# Patient Record
Sex: Male | Born: 1940 | Race: White | Hispanic: No | Marital: Married | Smoking: Former smoker
Health system: Southern US, Community
[De-identification: ages and names within clinical notes are randomized; demographics above are authoritative.]

## PROBLEM LIST (undated history)

## (undated) DIAGNOSIS — I10 Essential (primary) hypertension: Secondary | ICD-10-CM

## (undated) DIAGNOSIS — N400 Enlarged prostate without lower urinary tract symptoms: Secondary | ICD-10-CM

## (undated) DIAGNOSIS — M1990 Unspecified osteoarthritis, unspecified site: Secondary | ICD-10-CM

## (undated) DIAGNOSIS — K635 Polyp of colon: Secondary | ICD-10-CM

## (undated) DIAGNOSIS — K219 Gastro-esophageal reflux disease without esophagitis: Secondary | ICD-10-CM

## (undated) DIAGNOSIS — K317 Polyp of stomach and duodenum: Secondary | ICD-10-CM

## (undated) DIAGNOSIS — L570 Actinic keratosis: Secondary | ICD-10-CM

## (undated) HISTORY — DX: Polyp of stomach and duodenum: K31.7

## (undated) HISTORY — PX: SHOULDER SURGERY: SHX246

## (undated) HISTORY — DX: Unspecified osteoarthritis, unspecified site: M19.90

## (undated) HISTORY — DX: Polyp of colon: K63.5

## (undated) HISTORY — DX: Gastro-esophageal reflux disease without esophagitis: K21.9

## (undated) HISTORY — DX: Benign prostatic hyperplasia without lower urinary tract symptoms: N40.0

## (undated) HISTORY — PX: OTHER SURGICAL HISTORY: SHX169

## (undated) HISTORY — PX: TONSILLECTOMY: SUR1361

## (undated) HISTORY — DX: Actinic keratosis: L57.0

---

## 2004-09-04 ENCOUNTER — Encounter: Admission: RE | Admit: 2004-09-04 | Discharge: 2004-09-04 | Payer: Self-pay | Admitting: Internal Medicine

## 2005-03-28 ENCOUNTER — Emergency Department (HOSPITAL_COMMUNITY): Admission: EM | Admit: 2005-03-28 | Discharge: 2005-03-28 | Payer: Self-pay | Admitting: Family Medicine

## 2005-03-30 ENCOUNTER — Emergency Department (HOSPITAL_COMMUNITY): Admission: EM | Admit: 2005-03-30 | Discharge: 2005-03-30 | Payer: Self-pay | Admitting: Emergency Medicine

## 2005-12-20 ENCOUNTER — Ambulatory Visit (HOSPITAL_COMMUNITY): Admission: RE | Admit: 2005-12-20 | Discharge: 2005-12-20 | Payer: Self-pay | Admitting: Gastroenterology

## 2006-11-18 ENCOUNTER — Encounter: Admission: RE | Admit: 2006-11-18 | Discharge: 2006-11-18 | Payer: Self-pay | Admitting: Internal Medicine

## 2012-06-26 ENCOUNTER — Emergency Department (HOSPITAL_COMMUNITY): Payer: Medicare Other

## 2012-06-26 ENCOUNTER — Encounter (HOSPITAL_COMMUNITY): Payer: Self-pay | Admitting: Emergency Medicine

## 2012-06-26 ENCOUNTER — Emergency Department (HOSPITAL_COMMUNITY)
Admission: EM | Admit: 2012-06-26 | Discharge: 2012-06-26 | Disposition: A | Discharge status: Home / Self Care | Payer: Medicare Other | Attending: Emergency Medicine | Admitting: Emergency Medicine

## 2012-06-26 DIAGNOSIS — S61412A Laceration without foreign body of left hand, initial encounter: Secondary | ICD-10-CM

## 2012-06-26 DIAGNOSIS — S61409A Unspecified open wound of unspecified hand, initial encounter: Secondary | ICD-10-CM | POA: Insufficient documentation

## 2012-06-26 DIAGNOSIS — Z1833 Retained wood fragments: Secondary | ICD-10-CM | POA: Insufficient documentation

## 2012-06-26 DIAGNOSIS — W298XXA Contact with other powered powered hand tools and household machinery, initial encounter: Secondary | ICD-10-CM | POA: Insufficient documentation

## 2012-06-26 MED ORDER — OXYCODONE-ACETAMINOPHEN 5-325 MG PO TABS
1.0000 | ORAL_TABLET | Freq: Once | ORAL | Status: AC
  Administered 2012-06-26: 1 via ORAL

## 2012-06-26 MED ORDER — LIDOCAINE-EPINEPHRINE (PF) 2 %-1:200000 IJ SOLN
20.0000 mL | Freq: Once | INTRAMUSCULAR | Status: AC
  Administered 2012-06-26: 20 mL via INTRADERMAL

## 2012-06-26 MED ORDER — CEPHALEXIN 500 MG PO CAPS: 500.0000 mg | ORAL_CAPSULE | Freq: Four times a day (QID) | ORAL | Status: DC

## 2012-06-26 MED ORDER — TRAMADOL HCL 50 MG PO TABS: 50.0000 mg | ORAL_TABLET | Freq: Four times a day (QID) | ORAL | Status: DC | PRN

## 2012-06-26 NOTE — Progress Notes (Signed)
Orthopedic Tech Progress Note Patient Details:  Carl Moody 27-Nov-1940 [id number]  Ortho Devices Type of Ortho Device: Velcro wrist splint Ortho Device/Splint Location: left wrist Ortho Device/Splint Interventions: Application   Nikki Dom 06/26/2012, 3:51 PM

## 2012-06-26 NOTE — ED Notes (Signed)
Patient wound placed in 10 % betadine solution

## 2012-06-26 NOTE — ED Notes (Signed)
Pt reports cut left hand on a table saw. Bleeding controlled at this time. Pt able to wiggle all fingers. Pulses present.

## 2012-06-26 NOTE — ED Notes (Signed)
Laveda Norman , [place] attending to laceration at this time

## 2012-06-26 NOTE — ED Provider Notes (Signed)
History     CSN: [id number]  Arrival date & time 06/26/12  1224   First MD Initiated Contact with Patient 06/26/12 1301      Chief Complaint  Patient presents with  . Extremity Laceration    Left hand    (Consider location/radiation/quality/duration/timing/severity/associated sxs/prior treatment) HPI  71 year old male presents for evaluation of laceration to L hand.  Pt reports accidentally cuts his hand on table saw.  Incident happened 2 hrs ago.  Wound to dorsum of hand.  Bleeding controlled.  Able to move fingers and denies numbness or weakness.  Tetanus shot 2 years ago.  No other injury.  Is R handed.      History reviewed. No pertinent past medical history.  History reviewed. No pertinent past surgical history.  No family history on file.  History  Substance Use Topics  . Smoking status: Never Smoker   . Smokeless tobacco: Not on file  . Alcohol Use: Yes      Review of Systems  All other systems reviewed and are negative.    Allergies  Review of patient's allergies indicates no known allergies.  Home Medications   Current Outpatient Rx  Name Route Sig Dispense Refill  . OSTEO BI-FLEX ADV DOUBLE ST PO Oral Take 2 tablets by mouth daily.    . ADULT MULTIVITAMIN W/MINERALS CH Oral Take 1 tablet by mouth daily.    Marland Kitchen PANTOPRAZOLE SODIUM 40 MG PO TBEC Oral Take 40 mg by mouth daily.      BP 140/75  Pulse 60  Temp 97.8 F (36.6 C) (Oral)  Resp 20  SpO2 100%  Physical Exam  Nursing note and vitals reviewed. Constitutional: He is oriented to person, place, and time. He appears well-developed and well-nourished. No distress.  HENT:  Head: Normocephalic and atraumatic.  Eyes: Conjunctivae normal are normal.  Neck: Neck supple.  Musculoskeletal:       L hand: 7cm superficial lacerations horizontally across dorsum of hand proximally.  Wound appears dirty with table saw.  Radial pulse 2+, full ROM to all fingers with flexion and extension against resistance.   NVI.   L wrist normal with FROM.    Neurological: He is alert and oriented to person, place, and time.  Skin: Skin is warm. No rash noted.  Psychiatric: He has a normal mood and affect.    ED Course  Procedures (including critical care time)  Labs Reviewed - No data to display Dg Hand Complete Left  06/26/2012  *RADIOLOGY REPORT*  Clinical Data: History of table saw causing laceration.  LEFT HAND - COMPLETE 3+ VIEW  Comparison: None.  Findings: There appears to be some soft tissue swelling.  No opaque foreign body is seen.  No fracture or dislocation is seen. 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  IMPRESSION: Soft tissue injury with swelling.  No opaque foreign body evident. No fracture dislocation evident.  Osteoarthritic changes are described above.   Original Report Authenticated By: Crawford Givens, M.D.      No diagnosis found.  LACERATION REPAIR Performed by: Fayrene Helper, resident Sara Chu Authorized by: Fayrene Helper Consent: Verbal consent obtained. Risks and benefits: risks, benefits and alternatives were discussed Consent given by: patient Patient identity confirmed: provided demographic data Prepped and Draped in normal sterile fashion Wound explored  Laceration Location: L hand, proximal dorsum  Laceration Length: 7cm  Saw dust on  hand were fully irrigated and cleansed  Anesthesia: local infiltration  Local anesthetic: lidocaine 2% w epinephrine  Anesthetic total: 10 ml  Irrigation method: syringe Amount of cleaning: standard  Skin closure: prolene 5.0  Number of sutures: 11  Technique: simple interrupted, approximation of wound edge, sharp debridement using sterile scissor  Patient tolerance: Patient tolerated the procedure well with no immediate complications.  1. Left hand superficial laceration  MDM  Superficial laceration to L  dorsum of hand, neurovascularly intact.  Wound were irrigated and cleansed.  Pt understand there's always risk of retained fb (sawdust).  Wound were sutured by myself and resident.  Plan to apply wrist splint for protection to prevent dehiscence.  Pt to f/u with PCP in 10 days to have sutures remove.  Will provide abx as wound is dirty.  Pain medication prescribed.  Hand referral as needed.     Discussed care with my attending.    Nursing notes reviewed and considered in documentation  Previous records reviewed and considered  All labs/vitals reviewed and considered  xrays reviewed and considered      Fayrene Helper, PA-C 06/26/12 1516

## 2012-06-30 NOTE — ED Provider Notes (Signed)
Medical screening examination/treatment/procedure(s) were performed by non-physician practitioner and as supervising physician I was immediately available for consultation/collaboration.   Jaylin Benzel E Silvia Markuson, MD 06/30/12 0843 

## 2013-05-26 ENCOUNTER — Encounter: Payer: Self-pay | Admitting: *Deleted

## 2013-05-26 ENCOUNTER — Other Ambulatory Visit: Payer: Self-pay | Admitting: *Deleted

## 2013-05-26 ENCOUNTER — Encounter (INDEPENDENT_AMBULATORY_CARE_PROVIDER_SITE_OTHER): Payer: Medicare Other

## 2013-05-26 DIAGNOSIS — R002 Palpitations: Secondary | ICD-10-CM

## 2013-05-26 NOTE — Progress Notes (Signed)
Order per Dr Avva/ placed under Dr [place].

## 2013-05-26 NOTE — Progress Notes (Signed)
Patient ID: Carl Moody, male   DOB: 17-May-1941, 72 y.o.   MRN: [id number] E-Cardio verite 30 day cardiac event monitor applied to patient.

## 2013-07-01 ENCOUNTER — Telehealth: Payer: Self-pay

## 2013-07-01 NOTE — Telephone Encounter (Signed)
Patient called Dr.Jordan reviewed event monitor which revealed normal sinus rhythm with occasional pvc's.

## 2013-07-22 ENCOUNTER — Encounter (INDEPENDENT_AMBULATORY_CARE_PROVIDER_SITE_OTHER): Payer: Self-pay

## 2013-07-22 ENCOUNTER — Encounter: Payer: Self-pay | Admitting: Cardiology

## 2013-07-22 ENCOUNTER — Ambulatory Visit (INDEPENDENT_AMBULATORY_CARE_PROVIDER_SITE_OTHER): Payer: Medicare Other | Admitting: Cardiology

## 2013-07-22 VITALS — BP 130/80 | HR 72 | Ht 66.0 in | Wt 161.0 lb

## 2013-07-22 DIAGNOSIS — I493 Ventricular premature depolarization: Secondary | ICD-10-CM

## 2013-07-22 DIAGNOSIS — I4949 Other premature depolarization: Secondary | ICD-10-CM

## 2013-07-22 NOTE — Progress Notes (Signed)
Carl Moody Date of Birth: 1940-10-10 Medical Record #[id number]  History of Present Illness: [name] is seen at the request of Dr. Felipa Eth for evaluation of palpitations. He is a pleasant 72 year old male, previously healthy who reports that in August he began experiencing symptoms of palpitations. He states he felt like he had a sudden rush of adrenaline in his chest and his heart would race. It would last 15-20 seconds and then resolved. Initially this was occurring several times a day but then it tapered off and within 2-3 weeks his symptoms had resolved. He denied any associated chest pain, shortness of breath, dizziness, or syncope. There were no clear aggravating features. He denies any use of decongestants. He only drinks one or 2 caffeinated beverages a day.   Current Outpatient Prescriptions on File Prior to Visit  Medication Sig Dispense Refill  . Misc Natural Products (OSTEO BI-FLEX ADV DOUBLE ST PO) Take 2 tablets by mouth daily.      . Multiple Vitamin (MULTIVITAMIN WITH MINERALS) TABS Take 1 tablet by mouth daily.      . traMADol (ULTRAM) 50 MG tablet Take 1 tablet (50 mg total) by mouth every 6 (six) hours as needed for pain.  15 tablet  0   No current facility-administered medications on file prior to visit.    No Known Allergies  Past Medical History  Diagnosis Date  . GERD (gastroesophageal reflux disease)   . DJD (degenerative joint disease)   . BPH (benign prostatic hyperplasia)     Past Surgical History  Procedure Laterality Date  . Cataracts    . Shoulder surgery Right     History  Smoking status  . Former Smoker  Smokeless tobacco  . Not on file    History  Alcohol Use  . Yes    History reviewed. No pertinent family history.  Review of Systems:  As noted in history of present illness .  All other systems were reviewed and are negative.  Physical Exam: BP 130/80  Pulse 72  Ht 5\' 6"  (1.676 m)  Wt 161 lb (73.029 kg)  BMI 26 kg/m2 He is  a pleasant white male in no acute distress. HEENT: Normocephalic, atraumatic. Pupils equal round and reactive to light accommodation. Extraocular movements are full. Oropharynx is clear. Neck is without JVD, adenopathy, thyromegaly, or bruits. Lungs: Clear Cardiovascular: Regular rate and rhythm. Normal S1 and S2. No gallop, murmur, or click. Abdomen: Soft and nontender. No masses or bruits. Extremities: No cyanosis or edema. Pulses are 2+ and symmetric. Skin: Warm and dry Neuro: Alert and oriented x3. Cranial nerves II through XII are intact.  LABORATORY DATA:  laboratory data reviewed from 05/25/2013. This included a complete chemistry panel, CBC, and TSH. These were all normal with exception of a potassium of 5.4. ECG demonstrates normal sinus rhythm with sinus arrhythmia. It is otherwise normal.  Event monitor was reviewed from August 19 through 06/24/2013. This demonstrated normal sinus rhythm with occasional PVC. Symptoms of rapid or fast heartbeat did not correlate with arrhythmia. He had one episode of sinus tachycardia a rate of 149 beats per minute but the patient cannot recall when his activity was at that time.   Assessment / Plan:  1.PVCs. These do not correlate with his symptoms. Event monitor showed no significant arrhythmia during his symptoms of tachycardia. Given the lack of any significant cardiac risk factors, normal ECG, and normal cardiac exam I think these PVCs are benign. I recommended he limit his caffeine intake. Otherwise,  no further cardiac evaluation warranted. I will see him as needed.

## 2013-07-22 NOTE — Patient Instructions (Signed)
Let me know if your symptoms recur/worsen.  I will see you as needed.

## 2015-03-11 ENCOUNTER — Ambulatory Visit: Payer: PRIVATE HEALTH INSURANCE | Admitting: Physician Assistant

## 2015-04-29 ENCOUNTER — Other Ambulatory Visit: Payer: Self-pay | Admitting: Physician Assistant

## 2015-06-07 ENCOUNTER — Telehealth: Payer: Self-pay | Admitting: Cardiology

## 2015-06-07 NOTE — Telephone Encounter (Signed)
Received records from [facility] for appointment with Dr Martinique on 06/08/15.  Records given to [facility] (medical records) for Dr Doug Sou schedule on 06/08/15. lp

## 2015-06-08 ENCOUNTER — Encounter: Payer: Self-pay | Admitting: Cardiology

## 2015-06-08 ENCOUNTER — Ambulatory Visit (INDEPENDENT_AMBULATORY_CARE_PROVIDER_SITE_OTHER): Payer: Commercial Managed Care - HMO | Admitting: Cardiology

## 2015-06-08 VITALS — BP 138/84 | HR 75 | Ht 65.0 in | Wt 161.0 lb

## 2015-06-08 DIAGNOSIS — R079 Chest pain, unspecified: Secondary | ICD-10-CM | POA: Diagnosis not present

## 2015-06-08 DIAGNOSIS — I493 Ventricular premature depolarization: Secondary | ICD-10-CM

## 2015-06-08 NOTE — Patient Instructions (Signed)
We will schedule you for a stress test  

## 2015-06-08 NOTE — Progress Notes (Signed)
   Carl Moody Date of Birth: 1940-10-12 Medical Record #[id number]  History of Present Illness: Carl Moody is seen at the request of Dr. Dagmar Hait for evaluation of exertional chest pain. He is a pleasant 74 year old male, who was seen in 2014 with some minor palpitations. noted to have occ. PVC. These were felt to be benign and subsequently his symptoms resolved. Over the past 6 months he has noted symptoms of chest tightness and SOB with exertion. This occurs with walking and is worse walking up hill or carrying a load. Relieved with rest. Lasts a couple of minutes. No diaphoresis, N/V. No radiation. Localized to central chest. No history of DM, HTN, hypercholesterolemia, or family history of CAD. Quit smoking in 1971.  Current Outpatient Prescriptions on Moody Prior to Visit  Medication Sig Dispense Refill  . Misc Natural Products (OSTEO BI-FLEX ADV DOUBLE ST PO) Take 2 tablets by mouth daily.    . Multiple Vitamin (MULTIVITAMIN WITH MINERALS) TABS Take 1 tablet by mouth daily.    Marland Kitchen omeprazole (PRILOSEC) 40 MG capsule Take 40 mg by mouth daily.    Earney Navy Bicarbonate (ZEGERID PO) Take 40 mg by mouth.     No current facility-administered medications on Moody prior to visit.    No Known Allergies  Past Medical History  Diagnosis Date  . GERD (gastroesophageal reflux disease)   . DJD (degenerative joint disease)   . BPH (benign prostatic hyperplasia)   . Gastric polyp   . Hyperplastic colon polyp     Past Surgical History  Procedure Laterality Date  . Cataracts    . Shoulder surgery Right     History  Smoking status  . Former Smoker  Smokeless tobacco  . Not on Moody    History  Alcohol Use  . Yes    History reviewed. No pertinent family history.  Review of Systems:  As noted in history of present illness .  All other systems were reviewed and are negative.  Physical Exam: BP 138/84 mmHg  Pulse 75  Ht 5\' 5"  (1.651 m)  Wt 73.029 kg (161 lb)  BMI 26.79  kg/m2 He is a pleasant white male in no acute distress. HEENT: Normocephalic, atraumatic. Pupils equal round and reactive to light accommodation. Extraocular movements are full. Oropharynx is clear. Neck is without JVD, adenopathy, thyromegaly, or bruits. Lungs: Clear Cardiovascular: Regular rate and rhythm. Normal S1 and S2. No gallop, murmur, or click. Abdomen: Soft and nontender. No masses or bruits. Extremities: No cyanosis or edema. Pulses are 2+ and symmetric. Some varicosities left leg.  Skin: Warm and dry Neuro: Alert and oriented x3. Cranial nerves II through XII are intact.  LABORATORY DATA:  laboratory data reviewed from 02/14/15. This included a complete chemistry panel, CBC, and TSH. These were all normal with exception of a potassium of 5.4. Cholesterol 167, triglycerides 113, HDL 51, LDL 93. UA normal.  ECG today shows normal sinus rhythm with rate 64. Normal. I have personally reviewed and interpreted this study.    Assessment / Plan:  1. Exertion chest pain and dyspnea. Very concerning for angina. No cardiac risk factors. Normal Ecg. Will arrange for exercise stress test. If normal may try conservative medical therapy. If abnormal will proceed with cardiac cath.   2. PVCs. Asymptomatic now.

## 2015-06-29 ENCOUNTER — Telehealth (HOSPITAL_COMMUNITY): Payer: Self-pay

## 2015-06-29 NOTE — Telephone Encounter (Signed)
Encounter complete. 

## 2015-07-01 ENCOUNTER — Ambulatory Visit (HOSPITAL_COMMUNITY)
Admission: RE | Admit: 2015-07-01 | Discharge: 2015-07-01 | Disposition: A | Discharge status: Home / Self Care | Payer: Commercial Managed Care - HMO | Source: Ambulatory Visit | Attending: Cardiology | Admitting: Cardiology

## 2015-07-01 DIAGNOSIS — R079 Chest pain, unspecified: Secondary | ICD-10-CM | POA: Insufficient documentation

## 2015-07-01 DIAGNOSIS — R9439 Abnormal result of other cardiovascular function study: Secondary | ICD-10-CM | POA: Diagnosis not present

## 2015-07-01 LAB — EXERCISE TOLERANCE TEST
CHL CUP STRESS STAGE 1 HR: 72 {beats}/min
CHL CUP STRESS STAGE 4 DBP: 69 mmHg
CHL CUP STRESS STAGE 5 HR: 134 {beats}/min
CHL CUP STRESS STAGE 7 HR: 81 {beats}/min
CHL RATE OF PERCEIVED EXERTION: 16
CSEPED: 4 min
CSEPPHR: 134 {beats}/min
Estimated workload: 6.2 METS
Exercise duration (sec): 22 s
MPHR: 146 {beats}/min
Percent HR: 91 %
Percent of predicted max HR: 91 %
Rest HR: 66 {beats}/min
Stage 1 DBP: 94 mmHg
Stage 1 Grade: 0 %
Stage 1 SBP: 149 mmHg
Stage 1 Speed: 0 mph
Stage 2 Grade: 0 %
Stage 2 HR: 73 {beats}/min
Stage 2 Speed: 1 mph
Stage 3 Grade: 0.1 %
Stage 3 HR: 73 {beats}/min
Stage 3 Speed: 1 mph
Stage 4 Grade: 10 %
Stage 4 HR: 123 {beats}/min
Stage 4 SBP: 144 mmHg
Stage 4 Speed: 1.7 mph
Stage 5 Grade: 12 %
Stage 5 Speed: 2.5 mph
Stage 6 DBP: 82 mmHg
Stage 6 Grade: 0 %
Stage 6 HR: 108 {beats}/min
Stage 6 SBP: 131 mmHg
Stage 6 Speed: 0 mph
Stage 7 DBP: 94 mmHg
Stage 7 Grade: 0 %
Stage 7 SBP: 158 mmHg
Stage 7 Speed: 0 mph

## 2015-07-04 ENCOUNTER — Other Ambulatory Visit: Payer: Self-pay

## 2015-07-04 DIAGNOSIS — R079 Chest pain, unspecified: Secondary | ICD-10-CM

## 2015-07-07 ENCOUNTER — Ambulatory Visit
Admission: RE | Admit: 2015-07-07 | Discharge: 2015-07-07 | Disposition: A | Discharge status: Home / Self Care | Payer: Commercial Managed Care - HMO | Source: Ambulatory Visit | Attending: Cardiology | Admitting: Cardiology

## 2015-07-07 ENCOUNTER — Other Ambulatory Visit: Payer: Self-pay | Admitting: Cardiology

## 2015-07-07 DIAGNOSIS — R079 Chest pain, unspecified: Secondary | ICD-10-CM

## 2015-07-08 LAB — CBC WITH DIFFERENTIAL/PLATELET
BASOS ABS: 0 10*3/uL (ref 0.0–0.1)
BASOS PCT: 0 % (ref 0–1)
Eosinophils Absolute: 0.3 10*3/uL (ref 0.0–0.7)
Eosinophils Relative: 4 % (ref 0–5)
HCT: 49.9 % (ref 39.0–52.0)
Hemoglobin: 16.4 g/dL (ref 13.0–17.0)
Lymphocytes Relative: 27 % (ref 12–46)
Lymphs Abs: 1.8 10*3/uL (ref 0.7–4.0)
MCH: 30 pg (ref 26.0–34.0)
MCHC: 32.9 g/dL (ref 30.0–36.0)
MCV: 91.2 fL (ref 78.0–100.0)
MPV: 10.9 fL (ref 8.6–12.4)
Monocytes Absolute: 0.8 10*3/uL (ref 0.1–1.0)
Monocytes Relative: 12 % (ref 3–12)
NEUTROS ABS: 3.8 10*3/uL (ref 1.7–7.7)
Neutrophils Relative %: 57 % (ref 43–77)
Platelets: 228 10*3/uL (ref 150–400)
RBC: 5.47 MIL/uL (ref 4.22–5.81)
RDW: 14.3 % (ref 11.5–15.5)
WBC: 6.7 10*3/uL (ref 4.0–10.5)

## 2015-07-08 LAB — BASIC METABOLIC PANEL
BUN: 18 mg/dL (ref 7–25)
CO2: 30 mmol/L (ref 20–31)
Calcium: 9.4 mg/dL (ref 8.6–10.3)
Chloride: 101 mmol/L (ref 98–110)
Creat: 1.14 mg/dL (ref 0.70–1.18)
Glucose, Bld: 82 mg/dL (ref 65–99)
POTASSIUM: 4.7 mmol/L (ref 3.5–5.3)
SODIUM: 139 mmol/L (ref 135–146)

## 2015-07-08 LAB — PROTIME-INR
INR: 0.99 (ref <1.50)
Prothrombin Time: 13.2 seconds (ref 11.6–15.2)

## 2015-07-13 ENCOUNTER — Encounter (HOSPITAL_COMMUNITY): Payer: Self-pay | Admitting: Cardiology

## 2015-07-13 ENCOUNTER — Ambulatory Visit (HOSPITAL_COMMUNITY)
Admission: RE | Admit: 2015-07-13 | Discharge: 2015-07-13 | Disposition: A | Discharge status: Home / Self Care | Payer: Commercial Managed Care - HMO | Source: Ambulatory Visit | Attending: Cardiology | Admitting: Cardiology

## 2015-07-13 ENCOUNTER — Encounter (HOSPITAL_COMMUNITY)
Admission: RE | Disposition: A | Discharge status: Home / Self Care | Payer: Self-pay | Source: Ambulatory Visit | Attending: Cardiology

## 2015-07-13 DIAGNOSIS — R9439 Abnormal result of other cardiovascular function study: Secondary | ICD-10-CM | POA: Insufficient documentation

## 2015-07-13 DIAGNOSIS — N400 Enlarged prostate without lower urinary tract symptoms: Secondary | ICD-10-CM | POA: Diagnosis not present

## 2015-07-13 DIAGNOSIS — I493 Ventricular premature depolarization: Secondary | ICD-10-CM | POA: Diagnosis not present

## 2015-07-13 DIAGNOSIS — R079 Chest pain, unspecified: Secondary | ICD-10-CM | POA: Diagnosis present

## 2015-07-13 DIAGNOSIS — Z87891 Personal history of nicotine dependence: Secondary | ICD-10-CM | POA: Insufficient documentation

## 2015-07-13 DIAGNOSIS — K219 Gastro-esophageal reflux disease without esophagitis: Secondary | ICD-10-CM | POA: Insufficient documentation

## 2015-07-13 DIAGNOSIS — K317 Polyp of stomach and duodenum: Secondary | ICD-10-CM | POA: Insufficient documentation

## 2015-07-13 HISTORY — PX: CARDIAC CATHETERIZATION: SHX172

## 2015-07-13 SURGERY — LEFT HEART CATH AND CORONARY ANGIOGRAPHY

## 2015-07-13 MED ORDER — SODIUM CHLORIDE 0.9 % IJ SOLN: 3.0000 mL | INTRAMUSCULAR | Status: DC | PRN

## 2015-07-13 MED ORDER — FENTANYL CITRATE (PF) 100 MCG/2ML IJ SOLN
INTRAMUSCULAR | Status: AC
  Filled 2015-07-13: qty 4

## 2015-07-13 MED ORDER — SODIUM CHLORIDE 0.9 % WEIGHT BASED INFUSION
3.0000 mL/kg/h | INTRAVENOUS | Status: DC
  Administered 2015-07-13: 3 mL/kg/h via INTRAVENOUS

## 2015-07-13 MED ORDER — FENTANYL CITRATE (PF) 100 MCG/2ML IJ SOLN
INTRAMUSCULAR | Status: DC | PRN
  Administered 2015-07-13: 25 ug via INTRAVENOUS

## 2015-07-13 MED ORDER — MIDAZOLAM HCL 2 MG/2ML IJ SOLN
INTRAMUSCULAR | Status: DC | PRN
  Administered 2015-07-13: 1 mg via INTRAVENOUS

## 2015-07-13 MED ORDER — LIDOCAINE HCL (PF) 1 % IJ SOLN
INTRAMUSCULAR | Status: AC
  Filled 2015-07-13: qty 30

## 2015-07-13 MED ORDER — ASPIRIN 81 MG PO CHEW
CHEWABLE_TABLET | ORAL | Status: AC
  Administered 2015-07-13: 81 mg via ORAL
  Filled 2015-07-13: qty 1

## 2015-07-13 MED ORDER — LIDOCAINE HCL (PF) 1 % IJ SOLN
INTRAMUSCULAR | Status: DC | PRN
  Administered 2015-07-13: 11:00:00

## 2015-07-13 MED ORDER — SODIUM CHLORIDE 0.9 % IJ SOLN: 3.0000 mL | Freq: Two times a day (BID) | INTRAMUSCULAR | Status: DC

## 2015-07-13 MED ORDER — ASPIRIN 81 MG PO CHEW
81.0000 mg | CHEWABLE_TABLET | ORAL | Status: AC
  Administered 2015-07-13: 81 mg via ORAL

## 2015-07-13 MED ORDER — SODIUM CHLORIDE 0.9 % IV SOLN: 250.0000 mL | INTRAVENOUS | Status: DC | PRN

## 2015-07-13 MED ORDER — HEPARIN SODIUM (PORCINE) 1000 UNIT/ML IJ SOLN
INTRAMUSCULAR | Status: AC
  Filled 2015-07-13: qty 1

## 2015-07-13 MED ORDER — SODIUM CHLORIDE 0.9 % WEIGHT BASED INFUSION: 3.0000 mL/kg/h | INTRAVENOUS | Status: DC

## 2015-07-13 MED ORDER — HEPARIN SODIUM (PORCINE) 1000 UNIT/ML IJ SOLN
INTRAMUSCULAR | Status: DC | PRN
  Administered 2015-07-13: 4000 [IU] via INTRAVENOUS

## 2015-07-13 MED ORDER — MIDAZOLAM HCL 2 MG/2ML IJ SOLN
INTRAMUSCULAR | Status: AC
  Filled 2015-07-13: qty 4

## 2015-07-13 MED ORDER — SODIUM CHLORIDE 0.9 % WEIGHT BASED INFUSION: 1.0000 mL/kg/h | INTRAVENOUS | Status: DC

## 2015-07-13 MED ORDER — VERAPAMIL HCL 2.5 MG/ML IV SOLN
INTRAVENOUS | Status: DC | PRN
  Administered 2015-07-13: 11:00:00 via INTRA_ARTERIAL

## 2015-07-13 SURGICAL SUPPLY — 11 items

## 2015-07-13 NOTE — Interval H&P Note (Signed)
History and Physical Interval Note:  07/13/2015 10:21 AM  Carl Moody  has presented today for surgery, with the diagnosis of cp  The various methods of treatment have been discussed with the patient and family. After consideration of risks, benefits and other options for treatment, the patient has consented to  Procedure(s): Left Heart Cath and Coronary Angiography (N/A) as a surgical intervention .  The patient's history has been reviewed, patient examined, no change in status, stable for surgery.  I have reviewed the patient's chart and labs.  Questions were answered to the patient's satisfaction.    Cath Lab Visit (complete for each Cath Lab visit)  Clinical Evaluation Leading to the Procedure:   ACS: No.  Non-ACS:    Anginal Classification: CCS III  Anti-ischemic medical therapy: No Therapy  Non-Invasive Test Results: Intermediate-risk stress test findings: cardiac mortality 1-3%/year  Prior CABG: No previous CABG       [name] [facility] 07/13/2015 10:22 AM

## 2015-07-13 NOTE — Discharge Instructions (Signed)
Radial Site Care °Refer to this sheet in the next few weeks. These instructions provide you with information about caring for yourself after your procedure. Your health care provider may also give you more specific instructions. Your treatment has been planned according to current medical practices, but problems sometimes occur. Call your health care provider if you have any problems or questions after your procedure. °WHAT TO EXPECT AFTER THE PROCEDURE °After your procedure, it is typical to have the following: °· Bruising at the radial site that usually fades within 1-2 weeks. °· Blood collecting in the tissue (hematoma) that may be painful to the touch. It should usually decrease in size and tenderness within 1-2 weeks. °HOME CARE INSTRUCTIONS °· Take medicines only as directed by your health care provider. °· You may shower 24-48 hours after the procedure or as directed by your health care provider. Remove the bandage (dressing) and gently wash the site with plain soap and water. Pat the area dry with a clean towel. Do not rub the site, because this may cause bleeding. °· Do not take baths, swim, or use a hot tub until your health care provider approves. °· Check your insertion site every day for redness, swelling, or drainage. °· Do not apply powder or lotion to the site. °· Do not flex or bend the affected arm for 24 hours or as directed by your health care provider. °· Do not push or pull heavy objects with the affected arm for 24 hours or as directed by your health care provider. °· Do not lift over 10 lb (4.5 kg) for 5 days after your procedure or as directed by your health care provider. °· Ask your health care provider when it is okay to: °¨ Return to work or school. °¨ Resume usual physical activities or sports. °¨ Resume sexual activity. °· Do not drive home if you are discharged the same day as the procedure. Have someone else drive you. °· You may drive 24 hours after the procedure unless otherwise  instructed by your health care provider. °· Do not operate machinery or power tools for 24 hours after the procedure. °· If your procedure was done as an outpatient procedure, which means that you went home the same day as your procedure, a responsible adult should be with you for the first 24 hours after you arrive home. °· Keep all follow-up visits as directed by your health care provider. This is important. °SEEK MEDICAL CARE IF: °· You have a fever. °· You have chills. °· You have increased bleeding from the radial site. Hold pressure on the site and call 911. °SEEK IMMEDIATE MEDICAL CARE IF: °· You have unusual pain at the radial site. °· You have redness, warmth, or swelling at the radial site. °· You have drainage (other than a small amount of blood on the dressing) from the radial site. °· The radial site is bleeding, and the bleeding does not stop after 30 minutes of holding steady pressure on the site. °· Your arm or hand becomes pale, cool, tingly, or numb. °  °This information is not intended to replace advice given to you by your health care provider. Make sure you discuss any questions you have with your health care provider. °  °Document Released: 10/27/2010 Document Revised: 10/15/2014 Document Reviewed: 04/12/2014 °Elsevier Interactive Patient Education ©2016 [employer] ° °

## 2015-07-13 NOTE — Research (Signed)
CAD LAD Informed Consent   Subject Name: Carl Moody  Subject met inclusion and exclusion criteria.  The informed consent form, study requirements and expectations were reviewed with the subject and questions and concerns were addressed prior to the signing of the consent form.  The subject verbalized understanding of the trail requirements.  The subject agreed to participate in the CAD LAD trial and signed the informed consent.  The informed consent was obtained prior to performance of any protocol-specific procedures for the subject.  A copy of the signed informed consent was given to the subject and a copy was placed in the subject's medical record.  [name] 07/13/2015, 09:25 am

## 2015-07-13 NOTE — H&P (Signed)
Carl [place] Date of Birth: [birth year] Medical Record #[id number]  History of Present Illness: Mr. Kenn File is seen at the request of Dr. Dagmar Hait for evaluation of exertional chest pain. He is a pleasant 74 year old male, who was seen in 2014 with some minor palpitations. noted to have occ. PVC. These were felt to be benign and subsequently his symptoms resolved. Over the past 6 months he has noted symptoms of chest tightness and SOB with exertion. This occurs with walking and is worse walking up hill or carrying a load. Relieved with rest. Lasts a couple of minutes. No diaphoresis, N/V. No radiation. Localized to central chest. No history of DM, HTN, hypercholesterolemia, or family history of CAD. Quit smoking in 1971. To further evaluate his chest pain he underwent stress testing on 07/01/15. This was abnormal as noted below. Cardiac cath was recommended.   Current Outpatient Prescriptions on File Prior to Visit  Medication Sig Dispense Refill  . Misc Natural Products (OSTEO BI-FLEX ADV DOUBLE ST PO) Take 2 tablets by mouth daily.    . Multiple Vitamin (MULTIVITAMIN WITH MINERALS) TABS Take 1 tablet by mouth daily.    Marland Kitchen omeprazole (PRILOSEC) 40 MG capsule Take 40 mg by mouth daily.    Earney Navy Bicarbonate (ZEGERID PO) Take 40 mg by mouth.     No current facility-administered medications on file prior to visit.    No Known Allergies  Past Medical History  Diagnosis Date  . GERD (gastroesophageal reflux disease)   . DJD (degenerative joint disease)   . BPH (benign prostatic hyperplasia)   . Gastric polyp   . Hyperplastic colon polyp     Past Surgical History  Procedure Laterality Date  . Cataracts    . Shoulder surgery Right     History  Smoking status  . Former Smoker  Smokeless tobacco  . Not on file    History  Alcohol Use  . Yes    History reviewed. No pertinent family  history.  Review of Systems: As noted in history of present illness . All other systems were reviewed and are negative.  Physical Exam: BP 138/84 mmHg  Pulse 75  Ht 5\' 5"  (1.651 m)  Wt 73.029 kg (161 lb)  BMI 26.79 kg/m2 He is a pleasant white male in no acute distress. HEENT: Normocephalic, atraumatic. Pupils equal round and reactive to light accommodation. Extraocular movements are full. Oropharynx is clear. Neck is without JVD, adenopathy, thyromegaly, or bruits. Lungs: Clear Cardiovascular: Regular rate and rhythm. Normal S1 and S2. No gallop, murmur, or click. Abdomen: Soft and nontender. No masses or bruits. Extremities: No cyanosis or edema. Pulses are 2+ and symmetric. Some varicosities left leg.  Skin: Warm and dry Neuro: Alert and oriented x3. Cranial nerves II through XII are intact.  LABORATORY DATA: laboratory data reviewed from 02/14/15. This included a complete chemistry panel, CBC, and TSH. These were all normal with exception of a potassium of 5.4. Cholesterol 167, triglycerides 113, HDL 51, LDL 93. UA normal.  ECG  shows normal sinus rhythm with rate 64. Normal. I have personally reviewed and interpreted this study.  Lab Results  Component Value Date   WBC 6.7 07/07/2015   HGB 16.4 07/07/2015   HCT 49.9 07/07/2015   PLT 228 07/07/2015   GLUCOSE 82 07/07/2015   NA 139 07/07/2015   K 4.7 07/07/2015   CL 101 07/07/2015   CREATININE 1.14 07/07/2015   BUN 18 07/07/2015   CO2 30 07/07/2015   INR 0.99  07/07/2015   ETT: 07/01/15: Study Highlights     Blood pressure demonstrated a blunted response to exercise.  Upsloping ST segment depression ST segment depression was noted during stress in the II, III and aVF leads, beginning at 4 minutes of stress, and returning to baseline after less than 1 minute of recovery.  No T wave inversion was noted during stress.     Stress Findings    ECG Baseline ECG is normal..   Stress Findings The patient exercised  following the Bruce protocol.  The patient reported chest pain, shortness of breath and fatigue during the stress test. The patient experienced non-limiting angina during the stress test.   The test was stopped because the patient complained of atypical chest pain and shortness of breath.   Heart rate demonstrated a normal response to exercise. Blood pressure demonstrated a blunted response to exercise. Overall, the patient's exercise capacity was normal.   85% of maximum heart rate was achieved after 3 minutes. Recovery time: 7 minutes.  Duke Treadmill Score: intermediate risk The patient's response to exercise was adequate for diagnosis.   Response to Stress Upsloping ST segment depression ST segment depression was noted during stress in the II, III and aVF leads, beginning at 4 minutes of stress, and returning to baseline after less than 1 minute of recovery.  No T wave inversion was noted during stress. Arrhythmias during stress: none.  Arrhythmias during recovery: none.  There were no significant arrhythmias noted during the test.  ECG was interpretable and conclusive.  There were no ischemic EKG changes on the EKG. However, only one blood pressure was recorded and so an adequate blood pressure response cannot be documented. In addition the patient had chest pain.  I would suggest this was somewhat inconclusive based on the possibility of blood pressure readings and the symptoms. If there is a high suspicion for coronary artery disease I would suggest further cardiovascular testing.    Assessment / Plan:  1. Exertion chest pain and dyspnea. Very concerning for angina. No cardiac risk factors. Normal Ecg. Will based on history. Abnormal stress test with poor exercise tolerance with intermediate risk. Recommend proceeding with cardiac cath with possible PCI if indicated. The procedure and risks were reviewed including but not limited to death, myocardial infarction, stroke, arrythmias,  bleeding, transfusion, emergency surgery, dye allergy, or renal dysfunction. The patient voices understanding and is agreeable to proceed.  2. PVCs. Asymptomatic now.

## 2015-07-27 ENCOUNTER — Ambulatory Visit (INDEPENDENT_AMBULATORY_CARE_PROVIDER_SITE_OTHER): Payer: Commercial Managed Care - HMO | Admitting: Nurse Practitioner

## 2015-07-27 ENCOUNTER — Encounter: Payer: Self-pay | Admitting: Nurse Practitioner

## 2015-07-27 VITALS — BP 130/76 | HR 74 | Ht 66.0 in | Wt 161.8 lb

## 2015-07-27 DIAGNOSIS — Z9889 Other specified postprocedural states: Secondary | ICD-10-CM

## 2015-07-27 NOTE — Progress Notes (Signed)
CARDIOLOGY OFFICE NOTE  Date:  07/27/2015    Carl [place] Date of Birth: 1941-03-04 Medical Record #[id number]  PCP:  Tivis Ringer, MD  Cardiologist:  Martinique    Chief Complaint  Patient presents with  . Post cardiac catheterization    Seen for Dr. Martinique    History of Present Illness: Carl [place] is a 74 y.o. male who presents today for a post cardiac cath visit. Seen for Dr. Martinique.   Seen initially in [place] with exertional chest pain. No other medical issues noted but rare palpitations/PVCs. He had abnormal GXT and was then referred on for cardiac cath - this was normal with normal LV function and no evidence of CAD.   Comes back today. Here alone. Feels ok. Little sore in his muscles and says the stress test was "a wake up call". Now in the gym every day and feels good. No more chest pain. Not short of breath. Working on "getting back in shape". He is happy with how he is doing and has no complaints or issues noted.   Past Medical History  Diagnosis Date  . GERD (gastroesophageal reflux disease)   . DJD (degenerative joint disease)   . BPH (benign prostatic hyperplasia)   . Gastric polyp   . Hyperplastic colon polyp     Past Surgical History  Procedure Laterality Date  . Cataracts    . Shoulder surgery Right   . Cardiac catheterization N/A 07/13/2015    Procedure: Left Heart Cath and Coronary Angiography;  Surgeon: Peter M Martinique, MD;  Location: [facility];  Service: Cardiovascular;  Laterality: N/A;     Medications: Current Outpatient Prescriptions  Medication Sig Dispense Refill  . aspirin EC 81 MG tablet Take 81 mg by mouth at bedtime.    Marland Kitchen ibuprofen (ADVIL,MOTRIN) 200 MG tablet Take 400 mg by mouth every 6 (six) hours as needed (pain).    Marland Kitchen omeprazole (PRILOSEC) 10 MG capsule Take 10 mg by mouth daily.    Earney Navy Bicarbonate (ZEGERID) 20-1100 MG CAPS capsule Take 1 capsule by mouth daily before breakfast.     No current  facility-administered medications for this visit.    Allergies: No Known Allergies  Social History: The patient  reports that he has quit smoking. He does not have any smokeless tobacco history on file. He reports that he drinks alcohol.   Family History: The patient's family history is not on file. His mom lived to be 100. Father died at 47 from [place].   Review of Systems: Please see the history of present illness.   Otherwise, the review of systems is positive for none.   All other systems are reviewed and negative.   Physical Exam: VS:  BP 130/76 mmHg  Pulse 74  Ht 5\' 6"  (1.676 m)  Wt 161 lb 12.8 oz (73.392 kg)  BMI 26.13 kg/m2  SpO2 98% .  BMI Body mass index is 26.13 kg/(m^2).  Wt Readings from Last 3 Encounters:  07/27/15 161 lb 12.8 oz (73.392 kg)  07/13/15 156 lb (70.761 kg)  06/08/15 161 lb (73.029 kg)    General: Pleasant. Well developed, well nourished and in no acute distress.  HEENT: Normal. Neck: Supple, no JVD, carotid bruits, or masses noted.  Cardiac: Regular rate and rhythm. No murmurs, rubs, or gallops. No edema.  Respiratory:  Lungs are clear to auscultation bilaterally with normal work of breathing.  GI: Soft and nontender.  MS: No deformity or atrophy.  Gait and ROM intact. Skin: Warm and dry. Color is normal.  Neuro:  Strength and sensation are intact and no gross focal deficits noted.  Psych: Alert, appropriate and with normal affect. Right wrist cath site is fine.   LABORATORY DATA:  EKG:  EKG is not ordered today.  Lab Results  Component Value Date   WBC 6.7 07/07/2015   HGB 16.4 07/07/2015   HCT 49.9 07/07/2015   PLT 228 07/07/2015   GLUCOSE 82 07/07/2015   NA 139 07/07/2015   K 4.7 07/07/2015   CL 101 07/07/2015   CREATININE 1.14 07/07/2015   BUN 18 07/07/2015   CO2 30 07/07/2015   INR 0.99 07/07/2015    BNP (last 3 results) No results for input(s): BNP in the last 8760 hours.  ProBNP (last 3 results) No results for input(s):  PROBNP in the last 8760 hours.   Other Studies Reviewed Today: Cardiac Cath Conclusion from October 2016     The left ventricular systolic function is normal.  1. Normal coronary anatomy 2. Normal LV function.  Plan: risk factor management. Aerobic exercise program.     Assessment/Plan: 1. Post cardiac catheterization with normal findings  2. Prior exertional chest pain - resolved.   3. PVCs - asymptomatic.   Current medicines are reviewed with the patient today.  The patient does not have concerns regarding medicines other than what has been noted above.  The following changes have been made:  See above.  Labs/ tests ordered today include:   No orders of the defined types were placed in this encounter.    Disposition:   FU prn. Otherwise, would defer management back to primary care.   Patient is agreeable to this plan and will call if any problems develop in the interim.   Signed: Burtis Junes, RN, ANP-C 07/27/2015 8:43 AM  [facility] [address] [place], [place]  [address] Phone: [phone] Fax: [phone]

## 2015-07-27 NOTE — Patient Instructions (Signed)
We will be checking the following labs today - NONE   Medication Instructions:    Continue with your current medicines.     Testing/Procedures To Be Arranged:  N/A  Follow-Up:   See Dr. Martinique as needed.     Other Special Instructions:   N/A  Call the [place] office at [phone] if you have any questions, problems or concerns.

## 2016-07-17 ENCOUNTER — Other Ambulatory Visit: Payer: Self-pay | Admitting: General Surgery

## 2016-07-20 ENCOUNTER — Encounter (HOSPITAL_BASED_OUTPATIENT_CLINIC_OR_DEPARTMENT_OTHER): Payer: Self-pay | Admitting: *Deleted

## 2016-07-26 ENCOUNTER — Encounter (HOSPITAL_BASED_OUTPATIENT_CLINIC_OR_DEPARTMENT_OTHER): Payer: Self-pay | Admitting: Certified Registered"

## 2016-07-26 ENCOUNTER — Ambulatory Visit (HOSPITAL_BASED_OUTPATIENT_CLINIC_OR_DEPARTMENT_OTHER): Payer: Commercial Managed Care - HMO | Admitting: Certified Registered"

## 2016-07-26 ENCOUNTER — Encounter (HOSPITAL_BASED_OUTPATIENT_CLINIC_OR_DEPARTMENT_OTHER)
Admission: RE | Disposition: A | Discharge status: Home / Self Care | Payer: Self-pay | Source: Ambulatory Visit | Attending: General Surgery

## 2016-07-26 ENCOUNTER — Ambulatory Visit (HOSPITAL_BASED_OUTPATIENT_CLINIC_OR_DEPARTMENT_OTHER)
Admission: RE | Admit: 2016-07-26 | Discharge: 2016-07-26 | Disposition: A | Discharge status: Home / Self Care | Payer: Commercial Managed Care - HMO | Source: Ambulatory Visit | Attending: General Surgery | Admitting: General Surgery

## 2016-07-26 DIAGNOSIS — Z87891 Personal history of nicotine dependence: Secondary | ICD-10-CM | POA: Diagnosis not present

## 2016-07-26 DIAGNOSIS — Z7982 Long term (current) use of aspirin: Secondary | ICD-10-CM | POA: Insufficient documentation

## 2016-07-26 DIAGNOSIS — D179 Benign lipomatous neoplasm, unspecified: Secondary | ICD-10-CM | POA: Insufficient documentation

## 2016-07-26 DIAGNOSIS — K219 Gastro-esophageal reflux disease without esophagitis: Secondary | ICD-10-CM | POA: Diagnosis not present

## 2016-07-26 DIAGNOSIS — Z79899 Other long term (current) drug therapy: Secondary | ICD-10-CM | POA: Diagnosis not present

## 2016-07-26 DIAGNOSIS — R2232 Localized swelling, mass and lump, left upper limb: Secondary | ICD-10-CM | POA: Diagnosis present

## 2016-07-26 HISTORY — PX: LIPOMA EXCISION: SHX5283

## 2016-07-26 SURGERY — EXCISION LIPOMA
Anesthesia: General | Site: Shoulder | Laterality: Left

## 2016-07-26 MED ORDER — GLYCOPYRROLATE 0.2 MG/ML IJ SOLN: 0.2000 mg | Freq: Once | INTRAMUSCULAR | Status: DC | PRN

## 2016-07-26 MED ORDER — SCOPOLAMINE 1 MG/3DAYS TD PT72: 1.0000 | MEDICATED_PATCH | Freq: Once | TRANSDERMAL | Status: DC | PRN

## 2016-07-26 MED ORDER — PROMETHAZINE HCL 25 MG/ML IJ SOLN: 6.2500 mg | INTRAMUSCULAR | Status: DC | PRN

## 2016-07-26 MED ORDER — LIDOCAINE 2% (20 MG/ML) 5 ML SYRINGE
INTRAMUSCULAR | Status: DC | PRN
  Administered 2016-07-26: 100 mg via INTRAVENOUS

## 2016-07-26 MED ORDER — GABAPENTIN 300 MG PO CAPS
300.0000 mg | ORAL_CAPSULE | ORAL | Status: AC
  Administered 2016-07-26: 300 mg via ORAL

## 2016-07-26 MED ORDER — ONDANSETRON HCL 4 MG/2ML IJ SOLN
INTRAMUSCULAR | Status: AC
  Filled 2016-07-26: qty 2

## 2016-07-26 MED ORDER — ACETAMINOPHEN 500 MG PO TABS
1000.0000 mg | ORAL_TABLET | ORAL | Status: AC
  Administered 2016-07-26: 1000 mg via ORAL

## 2016-07-26 MED ORDER — LACTATED RINGERS IV SOLN
INTRAVENOUS | Status: DC
  Administered 2016-07-26: 09:00:00 via INTRAVENOUS

## 2016-07-26 MED ORDER — MIDAZOLAM HCL 2 MG/2ML IJ SOLN: 1.0000 mg | INTRAMUSCULAR | Status: DC | PRN

## 2016-07-26 MED ORDER — GABAPENTIN 300 MG PO CAPS
ORAL_CAPSULE | ORAL | Status: AC
  Filled 2016-07-26: qty 1

## 2016-07-26 MED ORDER — CEFAZOLIN SODIUM-DEXTROSE 2-4 GM/100ML-% IV SOLN
INTRAVENOUS | Status: AC
  Filled 2016-07-26: qty 100

## 2016-07-26 MED ORDER — EPHEDRINE SULFATE 50 MG/ML IJ SOLN
INTRAMUSCULAR | Status: DC | PRN
  Administered 2016-07-26: 10 mg via INTRAVENOUS
  Administered 2016-07-26: 5 mg via INTRAVENOUS

## 2016-07-26 MED ORDER — DEXAMETHASONE SODIUM PHOSPHATE 4 MG/ML IJ SOLN
INTRAMUSCULAR | Status: DC | PRN
  Administered 2016-07-26: 4 mg via INTRAVENOUS

## 2016-07-26 MED ORDER — CEFAZOLIN SODIUM-DEXTROSE 2-4 GM/100ML-% IV SOLN
2.0000 g | INTRAVENOUS | Status: AC
  Administered 2016-07-26: 2 g via INTRAVENOUS

## 2016-07-26 MED ORDER — ONDANSETRON HCL 4 MG/2ML IJ SOLN
INTRAMUSCULAR | Status: DC | PRN
Start: 2016-07-26 — End: 2016-07-26
  Administered 2016-07-26: 4 mg via INTRAVENOUS

## 2016-07-26 MED ORDER — FENTANYL CITRATE (PF) 100 MCG/2ML IJ SOLN
50.0000 ug | INTRAMUSCULAR | Status: DC | PRN
  Administered 2016-07-26: 100 ug via INTRAVENOUS

## 2016-07-26 MED ORDER — PROPOFOL 10 MG/ML IV BOLUS
INTRAVENOUS | Status: DC | PRN
  Administered 2016-07-26: 150 mg via INTRAVENOUS

## 2016-07-26 MED ORDER — FENTANYL CITRATE (PF) 100 MCG/2ML IJ SOLN
INTRAMUSCULAR | Status: AC
  Filled 2016-07-26: qty 2

## 2016-07-26 MED ORDER — HYDROMORPHONE HCL 1 MG/ML IJ SOLN: 0.2500 mg | INTRAMUSCULAR | Status: DC | PRN

## 2016-07-26 MED ORDER — BUPIVACAINE HCL (PF) 0.25 % IJ SOLN
INTRAMUSCULAR | Status: DC | PRN
  Administered 2016-07-26: 11 mL

## 2016-07-26 MED ORDER — ACETAMINOPHEN 500 MG PO TABS
ORAL_TABLET | ORAL | Status: AC
  Filled 2016-07-26: qty 2

## 2016-07-26 MED ORDER — LIDOCAINE 2% (20 MG/ML) 5 ML SYRINGE
INTRAMUSCULAR | Status: AC
  Filled 2016-07-26: qty 5

## 2016-07-26 SURGICAL SUPPLY — 49 items
ADH SKN CLS APL DERMABOND .7 (GAUZE/BANDAGES/DRESSINGS) ×1
BLADE CLIPPER SURG (BLADE) IMPLANT
BLADE SURG 15 STRL LF DISP TIS (BLADE) ×1 IMPLANT
BLADE SURG 15 STRL SS (BLADE) ×2
CANISTER SUCT 1200ML W/VALVE (MISCELLANEOUS) IMPLANT
CHLORAPREP W/TINT 26ML (MISCELLANEOUS) ×2 IMPLANT
COVER BACK TABLE 60X90IN (DRAPES) ×2 IMPLANT
COVER MAYO STAND STRL (DRAPES) ×2 IMPLANT
DECANTER SPIKE VIAL GLASS SM (MISCELLANEOUS) IMPLANT
DERMABOND ADVANCED (GAUZE/BANDAGES/DRESSINGS) ×1
DERMABOND ADVANCED .7 DNX12 (GAUZE/BANDAGES/DRESSINGS) ×1 IMPLANT
DRAPE LAPAROTOMY 100X72 PEDS (DRAPES) ×2 IMPLANT
DRSG TEGADERM 4X4.75 (GAUZE/BANDAGES/DRESSINGS) IMPLANT
ELECT COATED BLADE 2.86 ST (ELECTRODE) ×1 IMPLANT
ELECT REM PT RETURN 9FT ADLT (ELECTROSURGICAL) ×2
ELECTRODE REM PT RTRN 9FT ADLT (ELECTROSURGICAL) ×1 IMPLANT
GAUZE PACKING IODOFORM 1/4X15 (GAUZE/BANDAGES/DRESSINGS) IMPLANT
GLOVE BIO SURGEON STRL SZ7 (GLOVE) ×2 IMPLANT
GLOVE BIOGEL PI IND STRL 6.5 (GLOVE) IMPLANT
GLOVE BIOGEL PI IND STRL 7.0 (GLOVE) IMPLANT
GLOVE BIOGEL PI IND STRL 7.5 (GLOVE) ×1 IMPLANT
GLOVE BIOGEL PI INDICATOR 6.5 (GLOVE) ×1
GLOVE BIOGEL PI INDICATOR 7.0 (GLOVE) ×2
GLOVE BIOGEL PI INDICATOR 7.5 (GLOVE) ×1
GOWN STRL REUS W/ TWL LRG LVL3 (GOWN DISPOSABLE) ×3 IMPLANT
GOWN STRL REUS W/TWL LRG LVL3 (GOWN DISPOSABLE) ×4
NDL HYPO 25X1 1.5 SAFETY (NEEDLE) ×1 IMPLANT
NEEDLE HYPO 25X1 1.5 SAFETY (NEEDLE) ×2 IMPLANT
NS IRRIG 1000ML POUR BTL (IV SOLUTION) IMPLANT
PACK BASIN DAY SURGERY FS (CUSTOM PROCEDURE TRAY) ×2 IMPLANT
PENCIL BUTTON HOLSTER BLD 10FT (ELECTRODE) ×2 IMPLANT
SPONGE GAUZE 4X4 12PLY STER LF (GAUZE/BANDAGES/DRESSINGS) IMPLANT
SUT ETHILON 2 0 FS 18 (SUTURE) IMPLANT
SUT MNCRL AB 4-0 PS2 18 (SUTURE) ×2 IMPLANT
SUT SILK 2 0 SH (SUTURE) IMPLANT
SUT VIC AB 2-0 SH 27 (SUTURE)
SUT VIC AB 2-0 SH 27XBRD (SUTURE) IMPLANT
SUT VIC AB 3-0 SH 27 (SUTURE) ×2
SUT VIC AB 3-0 SH 27X BRD (SUTURE) IMPLANT
SUT VICRYL 3-0 CR8 SH (SUTURE) IMPLANT
SUT VICRYL 4-0 PS2 18IN ABS (SUTURE) IMPLANT
SWAB COLLECTION DEVICE MRSA (MISCELLANEOUS) IMPLANT
SWAB CULTURE ESWAB REG 1ML (MISCELLANEOUS) IMPLANT
SYR CONTROL 10ML LL (SYRINGE) ×2 IMPLANT
TOWEL OR 17X24 6PK STRL BLUE (TOWEL DISPOSABLE) ×2 IMPLANT
TOWEL OR NON WOVEN STRL DISP B (DISPOSABLE) ×2 IMPLANT
TUBE CONNECTING 20X1/4 (TUBING) IMPLANT
UNDERPAD 30X30 (UNDERPADS AND DIAPERS) IMPLANT
YANKAUER SUCT BULB TIP NO VENT (SUCTIONS) IMPLANT

## 2016-07-26 NOTE — Interval H&P Note (Signed)
History and Physical Interval Note:  07/26/2016 9:08 AM  [name]  has presented today for surgery, with the diagnosis of left shoulder lipoma  The various methods of treatment have been discussed with the patient and family. After consideration of risks, benefits and other options for treatment, the patient has consented to  Procedure(s): EXCISION LEFT SHOULDER LIPOMA (Left) as a surgical intervention .  The patient's history has been reviewed, patient examined, no change in status, stable for surgery.  I have reviewed the patient's chart and labs.  Questions were answered to the patient's satisfaction.     Carl [place]

## 2016-07-26 NOTE — Op Note (Signed)
Preoperative diagnosis: Anterior left shoulder Mass. Postoperative diagnosis: Lipoma Procedure: Left shoulder mass excision, 2 x 2 centimeters, intramuscular Surgeon: Dr. Serita Grammes Anesthesia: Gen. Specimens: Left shoulder mass to pathology Estimated blood loss: Minimal Complications: None Drains: None Sponge and needle count was correct at completion Disposition to recovery in stable condition  Indications: This a 75 year old male with a symptomatic left shoulder mass. He desired excision. We discussed doing this under anesthesia.  Procedure: After full consent was obtained the patient was taken to the operating room. He was given antibiotics. He was placed under general anesthesia per his choice. He was then prepped and draped in the standard sterile surgical fashion. A surgical timeout was then performed.  I infiltrated Marcaine and lidocaine over the mass in the left anterior shoulder. I then made an incision. This was in an intramuscular position. I spread open the muscle and then found a lipoma. This was removed in its entirety and passed off to be sent to pathology. I then obtained hemostasis. This was closed with 3-0 Vicryl and 4 Monocryl. Glue was placed over this. He tolerated this well was extubated and transferred to the recovery room in stable condition.

## 2016-07-26 NOTE — Discharge Instructions (Signed)
Post Anesthesia Home Care Instructions  Activity: Get plenty of rest for the remainder of the day. A responsible adult should stay with you for 24 hours following the procedure.  For the next 24 hours, DO NOT: -Drive a car -Paediatric nurse -Drink alcoholic beverages -Take any medication unless instructed by your physician -Make any legal decisions or sign important papers.  Meals: Start with liquid foods such as gelatin or soup. Progress to regular foods as tolerated. Avoid greasy, spicy, heavy foods. If nausea and/or vomiting occur, drink only clear liquids until the nausea and/or vomiting subsides. Call your physician if vomiting continues.  Special Instructions/Symptoms: Your throat may feel dry or sore from the anesthesia or the breathing tube placed in your throat during surgery. If this causes discomfort, gargle with warm salt water. The discomfort should disappear within 24 hours.  If you had a scopolamine patch placed behind your ear for the management of post- operative nausea and/or vomiting:  1. The medication in the patch is effective for 72 hours, after which it should be removed.  Wrap patch in a tissue and discard in the trash. Wash hands thoroughly with soap and water. 2. You may remove the patch earlier than 72 hours if you experience unpleasant side effects which may include dry mouth, dizziness or visual disturbances. 3. Avoid touching the patch. Wash your hands with soap and water after contact with the patch.      [place] Office Phone Number [phone]  POST OP INSTRUCTIONS  Always review your discharge instruction sheet given to you by the facility where your surgery was performed.  IF YOU HAVE DISABILITY OR FAMILY LEAVE FORMS, YOU MUST BRING THEM TO THE OFFICE FOR PROCESSING.  DO NOT GIVE THEM TO YOUR DOCTOR.  1. A prescription for pain medication may be given to you upon discharge.  Take your pain medication as prescribed, if needed.   If narcotic pain medicine is not needed, then you may take acetaminophen (Tylenol), naprosyn (Alleve) or ibuprofen (Advil) as needed. 2. Take your usually prescribed medications unless otherwise directed 3. If you need a refill on your pain medication, please contact your pharmacy.  They will contact our office to request authorization.  Prescriptions will not be filled after 5pm or on week-ends. 4. You should eat very light the first 24 hours after surgery, such as soup, crackers, pudding, etc.  Resume your normal diet the day after surgery. 5. Most patients will experience some swelling and bruising in the area of surgery. You may use ice over it. This will resolve over several days.  6. It is common to experience some constipation if taking pain medication after surgery.  Increasing fluid intake and taking a stool softener will usually help or prevent this problem from occurring.  A mild laxative (Milk of Magnesia or Miralax) should be taken according to package directions if there are no bowel movements after 48 hours. 7. Unless discharge instructions indicate otherwise, you may remove your bandages 48 hours after surgery and you may shower at that time.  You may have steri-strips (small skin tapes) in place directly over the incision.  These strips should be left on the skin for 7-10 days and will come off on their own.  If your surgeon used skin glue on the incision, you may shower in 24 hours.  The glue will flake off over the next 2-3 weeks.  Any sutures or staples will be removed at the office during your follow-up visit. 8. ACTIVITIES:  You may resume regular daily activities (gradually increasing) beginning the next day.  Wearing a good support bra or sports bra minimizes pain and swelling.  You may have sexual intercourse when it is comfortable. a. You may drive when you no longer are taking prescription pain medication, you can comfortably wear a seatbelt, and you can safely maneuver your car  and apply brakes. b. RETURN TO WORK:  ______________________________________________________________________________________ 9. You should see your doctor in the office for a follow-up appointment approximately three weeks after your surgery.  Expect your pathology report 3-4 business days after your surgery.  You may call to check if you do not hear from [place] after three days.   WHEN TO CALL DR WAKEFIELD: 1. Fever over 101.0 2. Nausea and/or vomiting. 3. Extreme swelling or bruising. 4. Continued bleeding from incision. 5. Increased pain, redness, or drainage from the incision.  The clinic staff is available to answer your questions during regular business hours.  Please dont hesitate to call and ask to speak to one of the nurses for clinical concerns.  If you have a medical emergency, go to the nearest emergency room or call 911.  A surgeon from [facility] is always on call at the hospital.  For further questions, please visit [email or web address]

## 2016-07-26 NOTE — Anesthesia Procedure Notes (Signed)
Procedure Name: LMA Insertion Date/Time: 07/26/2016 9:31 AM Performed by: Baxter Flattery Pre-anesthesia Checklist: Patient identified, Emergency Drugs available, Suction available and Patient being monitored Patient Re-evaluated:Patient Re-evaluated prior to inductionOxygen Delivery Method: Circle system utilized Preoxygenation: Pre-oxygenation with 100% oxygen Intubation Type: IV induction Ventilation: Mask ventilation without difficulty LMA: LMA inserted LMA Size: 4.0 Number of attempts: 1 Airway Equipment and Method: Bite block Placement Confirmation: positive ETCO2 and breath sounds checked- equal and bilateral Tube secured with: Tape Dental Injury: Teeth and Oropharynx as per pre-operative assessment

## 2016-07-26 NOTE — Transfer of Care (Signed)
Immediate Anesthesia Transfer of Care Note  Patient: Carl [place]  Procedure(s) Performed: Procedure(s): EXCISION LEFT SHOULDER LIPOMA (Left)  Patient Location: PACU  Anesthesia Type:General  Level of Consciousness: awake, sedated and responds to stimulation  Airway & Oxygen Therapy: Patient Spontanous Breathing and Patient connected to face mask oxygen  Post-op Assessment: Report given to RN, Post -op Vital signs reviewed and stable and Patient moving all extremities  Post vital signs: Reviewed and stable  Last Vitals:  Vitals:   07/26/16 0825  BP: (!) 149/75  Pulse: (!) 54  Resp: 18  Temp: 36.4 C    Last Pain:  Vitals:   07/26/16 0825  TempSrc: Oral      Patients Stated Pain Goal: 0 [id number])  Complications: No apparent anesthesia complications

## 2016-07-26 NOTE — Anesthesia Postprocedure Evaluation (Signed)
Anesthesia Post Note  Patient: Carl [place]  Procedure(s) Performed: Procedure(s) (LRB): EXCISION LEFT SHOULDER LIPOMA (Left)  Patient location during evaluation: PACU Anesthesia Type: General Level of consciousness: sedated Pain management: pain level controlled Vital Signs Assessment: post-procedure vital signs reviewed and stable Respiratory status: spontaneous breathing and respiratory function stable Cardiovascular status: stable Anesthetic complications: no    Last Vitals:  Vitals:   07/26/16 1015 07/26/16 1030  BP: 136/70 131/86  Pulse: 65 65  Resp: 11 13  Temp:      Last Pain:  Vitals:   07/26/16 0825  TempSrc: Oral                 Fernando Stoiber DANIEL

## 2016-07-26 NOTE — H&P (Signed)
   Carl Moody who is otherwise healthy presents with a new mass for past month and a half on anterior left shoulder. describes some shoulder pain. not really bothering him except it is there   Other Problems Marjean Donna, CMA; 07/06/2016 10:01 AM) Arthritis Back Pain Enlarged Prostate Gastroesophageal Reflux Disease Hemorrhoids Other disease, cancer, significant illness  Past Surgical History Marjean Donna, CMA; 07/06/2016 10:01 AM) Cataract Surgery Bilateral. Tonsillectomy  Diagnostic Studies History Marjean Donna, CMA; 07/06/2016 10:01 AM) Colonoscopy 1-5 years ago  Allergies Davy Pique Bynum, CMA; 07/06/2016 10:01 AM) No Known Drug Allergies09/29/2017  Medication History (Sonya Bynum, CMA; 07/06/2016 10:02 AM) Omeprazole (20MG  Capsule DR, Oral) Active. Aspirin (81MG  Tablet Chewable, Oral) Active. Advil (100MG  Tablet Chewable, Oral) Active. Medications Reconciled  Social History Marjean Donna, CMA; 07/06/2016 10:01 AM) Alcohol use Occasional alcohol use. Caffeine use Coffee. Tobacco use Former smoker.  Family History Marjean Donna, [place]; 07/06/2016 10:01 AM) First Degree Relatives No pertinent family history    Review of Systems Davy Pique Bynum CMA; 07/06/2016 10:01 AM) General Not Present- Appetite Loss, Chills, Fatigue, Fever, Night Sweats, Weight Gain and Weight Loss. Skin Present- New Lesions. Not Present- Change in Wart/Mole, Dryness, Hives, Jaundice, Non-Healing Wounds, Rash and Ulcer. HEENT Not Present- Earache, Hearing Loss, Hoarseness, Nose Bleed, Oral Ulcers, Ringing in the Ears, Seasonal Allergies, Sinus Pain, Sore Throat, Visual Disturbances, Wears glasses/contact lenses and Yellow Eyes. Respiratory Not Present- Bloody sputum, Chronic Cough, Difficulty Breathing, Snoring and Wheezing. Breast Not Present- Breast Mass, Breast Pain, Nipple Discharge and Skin Changes. Cardiovascular Not Present- Chest Pain, Difficulty Breathing Lying Down, Leg Cramps, Palpitations,  Rapid Heart Rate, Shortness of Breath and Swelling of Extremities. Gastrointestinal Not Present- Abdominal Pain, Bloating, Bloody Stool, Change in Bowel Habits, Chronic diarrhea, Constipation, Difficulty Swallowing, Excessive gas, Gets full quickly at meals, Hemorrhoids, Indigestion, Nausea, Rectal Pain and Vomiting. Male Genitourinary Not Present- Blood in Urine, Change in Urinary Stream, Frequency, Impotence, Nocturia, Painful Urination, Urgency and Urine Leakage. Musculoskeletal Present- Back Pain and Joint Pain. Not Present- Joint Stiffness, Muscle Pain, Muscle Weakness and Swelling of Extremities. Neurological Not Present- Decreased Memory, Fainting, Headaches, Numbness, Seizures, Tingling, Tremor, Trouble walking and Weakness. Psychiatric Not Present- Anxiety, Bipolar, Change in Sleep Pattern, Depression, Fearful and Frequent crying. Endocrine Not Present- Cold Intolerance, Excessive Hunger, Hair Changes, Heat Intolerance, Hot flashes and New Diabetes. Hematology Not Present- Blood Thinners, Easy Bruising, Excessive bleeding, Gland problems, HIV and Persistent Infections.  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 I dont think his shoulder pain is from this. he would like to have excised. discussed excision under mac as well as recovery.

## 2016-07-26 NOTE — Anesthesia Preprocedure Evaluation (Addendum)
Anesthesia Evaluation  Patient identified by MRN, date of birth, ID band Patient awake    Reviewed: Allergy & Precautions, NPO status , Patient's Chart, lab work & pertinent test results  History of Anesthesia Complications Negative for: history of anesthetic complications  Airway Mallampati: II  TM Distance: >3 FB Neck ROM: Full    Dental no notable dental hx. (+) Dental Advisory Given   Pulmonary former smoker,    Pulmonary exam normal        Cardiovascular negative cardio ROS Normal cardiovascular exam     Neuro/Psych negative neurological ROS  negative psych ROS   GI/Hepatic Neg liver ROS, GERD  ,  Endo/Other  negative endocrine ROS  Renal/GU negative Renal ROS     Musculoskeletal   Abdominal   Peds  Hematology negative hematology ROS (+)   Anesthesia Other Findings   Reproductive/Obstetrics                            Anesthesia Physical Anesthesia Plan  ASA: II  Anesthesia Plan: General   Post-op Pain Management:    Induction: Intravenous  Airway Management Planned: LMA and Oral ETT  Additional Equipment:   Intra-op Plan:   Post-operative Plan: Extubation in OR  Informed Consent: I have reviewed the patients History and Physical, chart, labs and discussed the procedure including the risks, benefits and alternatives for the proposed anesthesia with the patient or authorized representative who has indicated his/her understanding and acceptance.   Dental advisory given  Plan Discussed with: CRNA, Anesthesiologist and Surgeon  Anesthesia Plan Comments:         Anesthesia Quick Evaluation

## 2016-07-27 ENCOUNTER — Encounter (HOSPITAL_BASED_OUTPATIENT_CLINIC_OR_DEPARTMENT_OTHER): Payer: Self-pay | Admitting: General Surgery

## 2016-11-05 IMAGING — CR DG CHEST 2V
2 series · 2 of 2 positions shown · non-contrast
Comparison: 11/18/2006 and CT 03/10/2008

CLINICAL DATA: Shortness breath and chest tightness 2 months. Pre
cardiac catheterization.

EXAM:
CHEST  2 VIEW

[w chest pa]
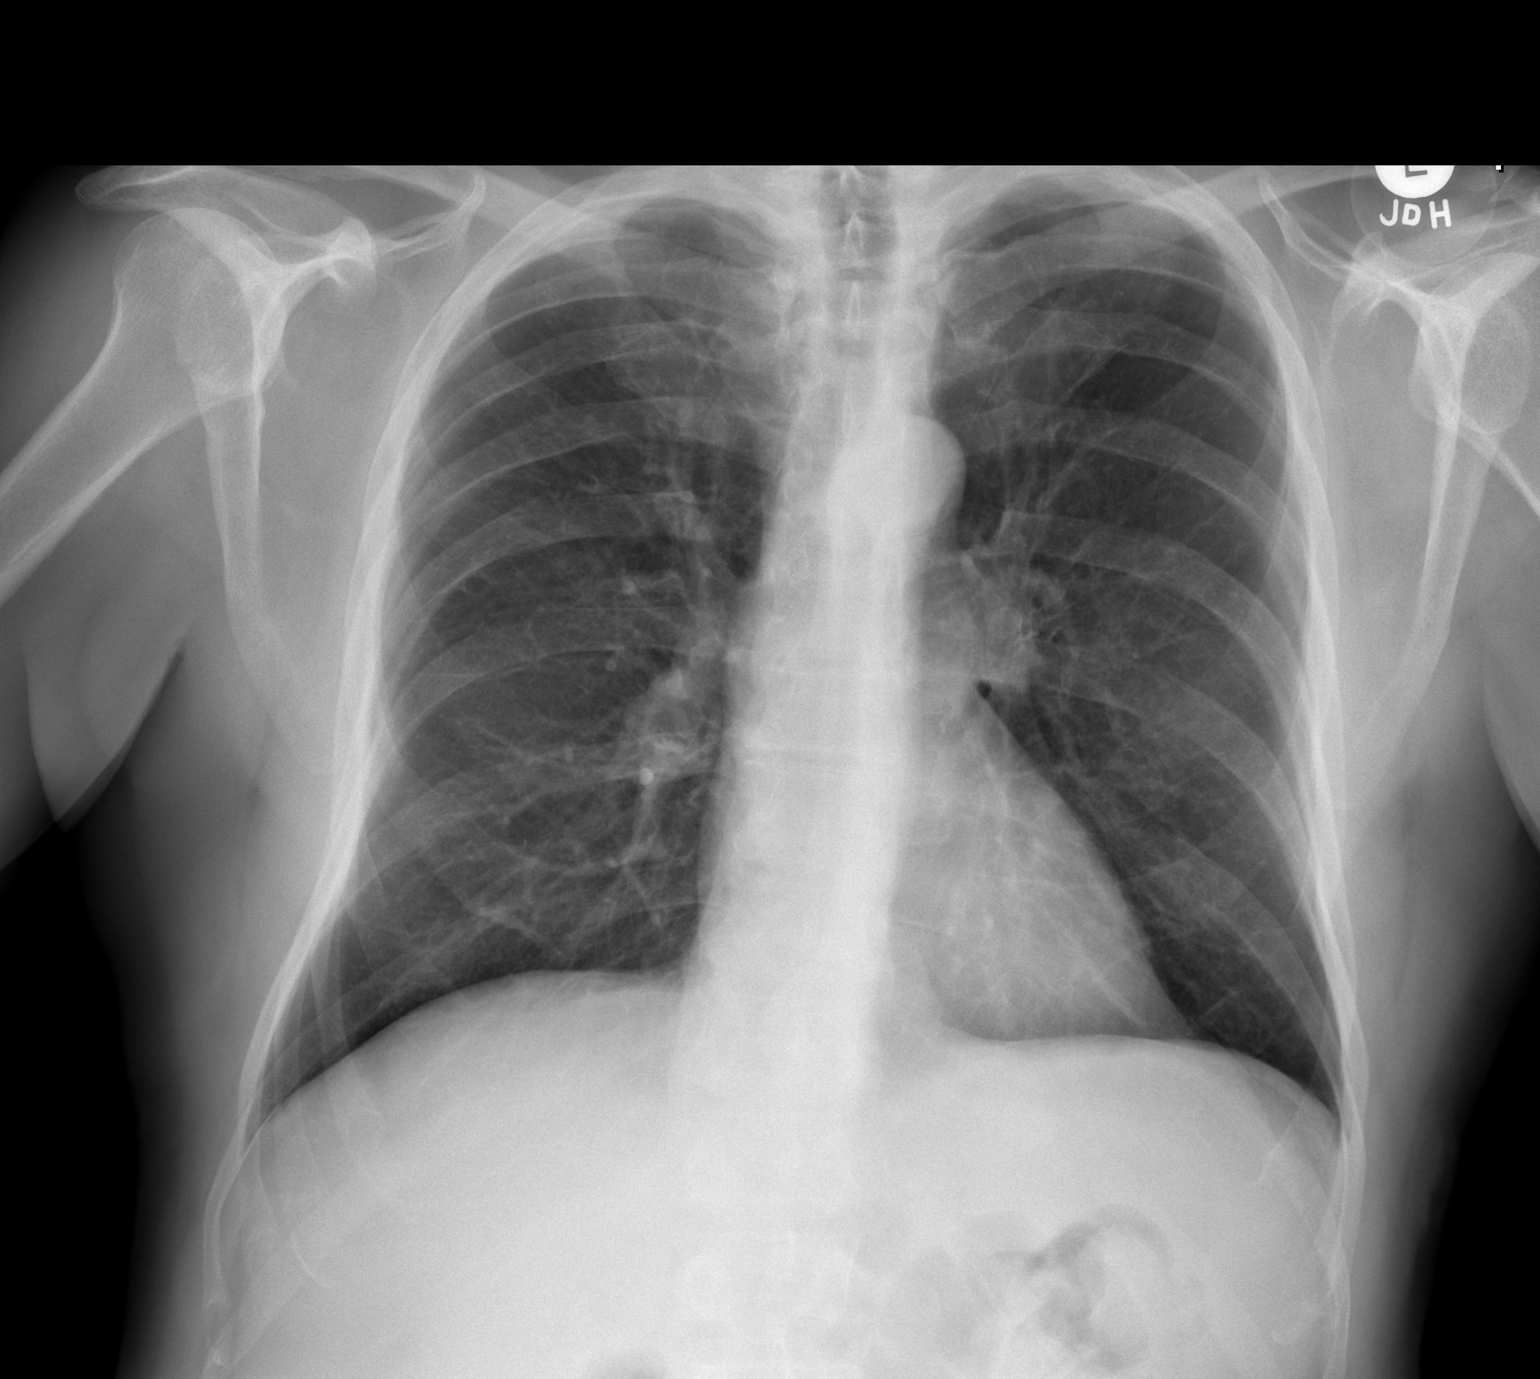

[w chest lat]
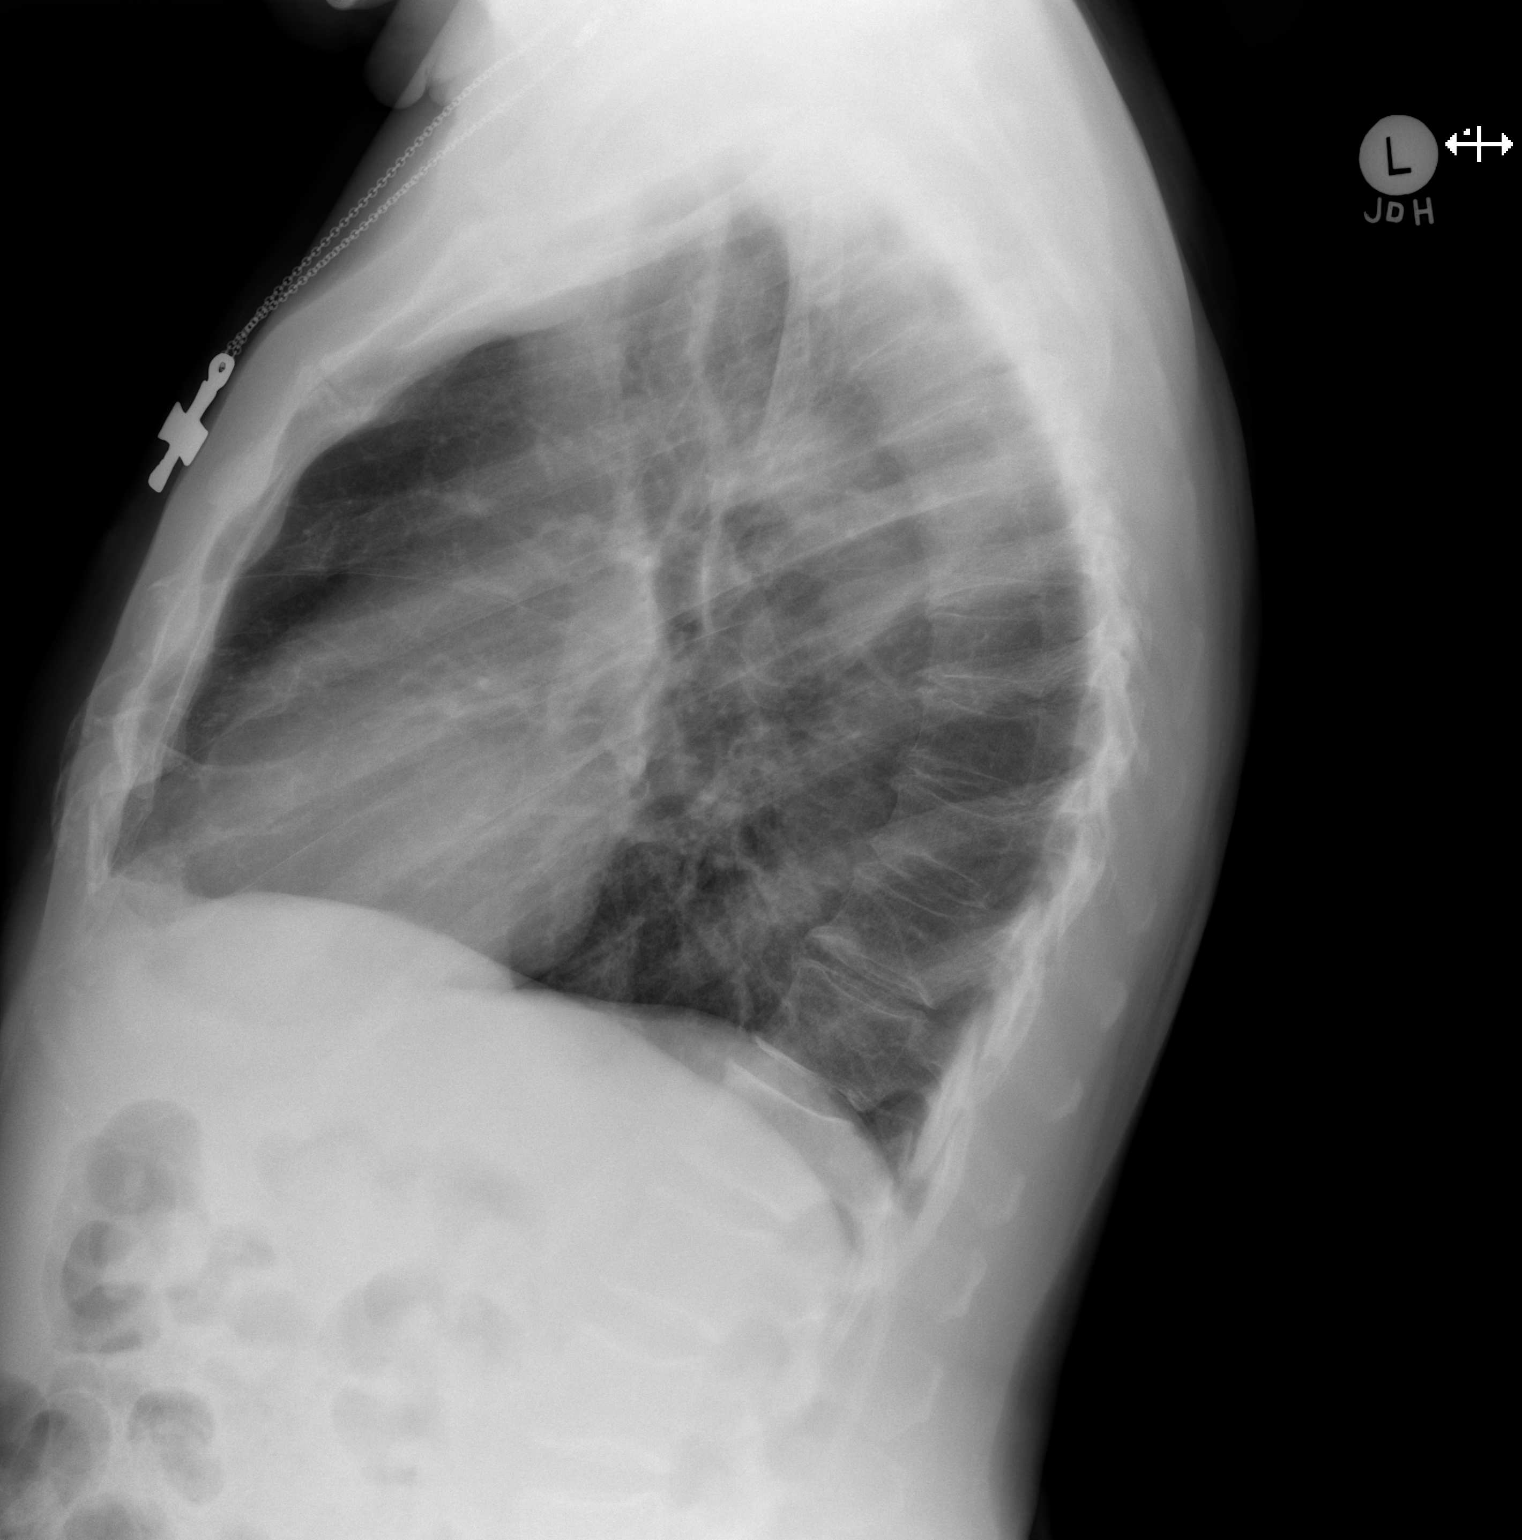

[2 of 2 positions shown; findings below may reference images not displayed]

FINDINGS: Lungs are adequately inflated and otherwise clear. Cardiomediastinal
silhouette is within normal. Mild prominence of the left hilum
unchanged. Mild degenerate change of the spine.
IMPRESSION: No active cardiopulmonary disease.

## 2016-12-11 DIAGNOSIS — H40013 Open angle with borderline findings, low risk, bilateral: Secondary | ICD-10-CM | POA: Diagnosis not present

## 2016-12-11 DIAGNOSIS — Z961 Presence of intraocular lens: Secondary | ICD-10-CM | POA: Diagnosis not present

## 2017-03-06 DIAGNOSIS — E784 Other hyperlipidemia: Secondary | ICD-10-CM | POA: Diagnosis not present

## 2017-03-06 DIAGNOSIS — Z125 Encounter for screening for malignant neoplasm of prostate: Secondary | ICD-10-CM | POA: Diagnosis not present

## 2017-03-06 DIAGNOSIS — Z0000 Encounter for general adult medical examination without abnormal findings: Secondary | ICD-10-CM | POA: Diagnosis not present

## 2017-03-13 DIAGNOSIS — E784 Other hyperlipidemia: Secondary | ICD-10-CM | POA: Diagnosis not present

## 2017-03-13 DIAGNOSIS — D179 Benign lipomatous neoplasm, unspecified: Secondary | ICD-10-CM | POA: Diagnosis not present

## 2017-03-13 DIAGNOSIS — Z6823 Body mass index (BMI) 23.0-23.9, adult: Secondary | ICD-10-CM | POA: Diagnosis not present

## 2017-03-13 DIAGNOSIS — M9903 Segmental and somatic dysfunction of lumbar region: Secondary | ICD-10-CM | POA: Diagnosis not present

## 2017-03-13 DIAGNOSIS — Z1389 Encounter for screening for other disorder: Secondary | ICD-10-CM | POA: Diagnosis not present

## 2017-03-13 DIAGNOSIS — K219 Gastro-esophageal reflux disease without esophagitis: Secondary | ICD-10-CM | POA: Diagnosis not present

## 2017-03-13 DIAGNOSIS — M4726 Other spondylosis with radiculopathy, lumbar region: Secondary | ICD-10-CM | POA: Diagnosis not present

## 2017-03-13 DIAGNOSIS — M1990 Unspecified osteoarthritis, unspecified site: Secondary | ICD-10-CM | POA: Diagnosis not present

## 2017-03-13 DIAGNOSIS — Z0000 Encounter for general adult medical examination without abnormal findings: Secondary | ICD-10-CM | POA: Diagnosis not present

## 2017-03-13 DIAGNOSIS — K635 Polyp of colon: Secondary | ICD-10-CM | POA: Diagnosis not present

## 2017-03-14 DIAGNOSIS — Z1212 Encounter for screening for malignant neoplasm of rectum: Secondary | ICD-10-CM | POA: Diagnosis not present

## 2017-03-27 DIAGNOSIS — M9903 Segmental and somatic dysfunction of lumbar region: Secondary | ICD-10-CM | POA: Diagnosis not present

## 2017-03-27 DIAGNOSIS — M4726 Other spondylosis with radiculopathy, lumbar region: Secondary | ICD-10-CM | POA: Diagnosis not present

## 2017-04-02 DIAGNOSIS — M4726 Other spondylosis with radiculopathy, lumbar region: Secondary | ICD-10-CM | POA: Diagnosis not present

## 2017-04-02 DIAGNOSIS — M9903 Segmental and somatic dysfunction of lumbar region: Secondary | ICD-10-CM | POA: Diagnosis not present

## 2017-04-04 DIAGNOSIS — M9903 Segmental and somatic dysfunction of lumbar region: Secondary | ICD-10-CM | POA: Diagnosis not present

## 2017-04-04 DIAGNOSIS — M4726 Other spondylosis with radiculopathy, lumbar region: Secondary | ICD-10-CM | POA: Diagnosis not present

## 2017-04-15 DIAGNOSIS — M4726 Other spondylosis with radiculopathy, lumbar region: Secondary | ICD-10-CM | POA: Diagnosis not present

## 2017-04-15 DIAGNOSIS — M9903 Segmental and somatic dysfunction of lumbar region: Secondary | ICD-10-CM | POA: Diagnosis not present

## 2017-04-18 DIAGNOSIS — M9903 Segmental and somatic dysfunction of lumbar region: Secondary | ICD-10-CM | POA: Diagnosis not present

## 2017-04-18 DIAGNOSIS — M4726 Other spondylosis with radiculopathy, lumbar region: Secondary | ICD-10-CM | POA: Diagnosis not present

## 2017-04-23 DIAGNOSIS — M4726 Other spondylosis with radiculopathy, lumbar region: Secondary | ICD-10-CM | POA: Diagnosis not present

## 2017-04-23 DIAGNOSIS — M9903 Segmental and somatic dysfunction of lumbar region: Secondary | ICD-10-CM | POA: Diagnosis not present

## 2017-04-25 DIAGNOSIS — M4726 Other spondylosis with radiculopathy, lumbar region: Secondary | ICD-10-CM | POA: Diagnosis not present

## 2017-04-25 DIAGNOSIS — M9903 Segmental and somatic dysfunction of lumbar region: Secondary | ICD-10-CM | POA: Diagnosis not present

## 2017-04-30 DIAGNOSIS — M9903 Segmental and somatic dysfunction of lumbar region: Secondary | ICD-10-CM | POA: Diagnosis not present

## 2017-04-30 DIAGNOSIS — M4726 Other spondylosis with radiculopathy, lumbar region: Secondary | ICD-10-CM | POA: Diagnosis not present

## 2017-05-07 DIAGNOSIS — M4726 Other spondylosis with radiculopathy, lumbar region: Secondary | ICD-10-CM | POA: Diagnosis not present

## 2017-05-07 DIAGNOSIS — M9903 Segmental and somatic dysfunction of lumbar region: Secondary | ICD-10-CM | POA: Diagnosis not present

## 2017-05-14 DIAGNOSIS — M9903 Segmental and somatic dysfunction of lumbar region: Secondary | ICD-10-CM | POA: Diagnosis not present

## 2017-05-14 DIAGNOSIS — M4726 Other spondylosis with radiculopathy, lumbar region: Secondary | ICD-10-CM | POA: Diagnosis not present

## 2017-05-28 DIAGNOSIS — M9903 Segmental and somatic dysfunction of lumbar region: Secondary | ICD-10-CM | POA: Diagnosis not present

## 2017-05-28 DIAGNOSIS — M4726 Other spondylosis with radiculopathy, lumbar region: Secondary | ICD-10-CM | POA: Diagnosis not present

## 2017-06-11 DIAGNOSIS — M4726 Other spondylosis with radiculopathy, lumbar region: Secondary | ICD-10-CM | POA: Diagnosis not present

## 2017-06-11 DIAGNOSIS — M9903 Segmental and somatic dysfunction of lumbar region: Secondary | ICD-10-CM | POA: Diagnosis not present

## 2017-07-10 DIAGNOSIS — D229 Melanocytic nevi, unspecified: Secondary | ICD-10-CM | POA: Diagnosis not present

## 2017-07-10 DIAGNOSIS — L570 Actinic keratosis: Secondary | ICD-10-CM | POA: Diagnosis not present

## 2017-10-29 DIAGNOSIS — M47812 Spondylosis without myelopathy or radiculopathy, cervical region: Secondary | ICD-10-CM | POA: Diagnosis not present

## 2017-10-29 DIAGNOSIS — M9901 Segmental and somatic dysfunction of cervical region: Secondary | ICD-10-CM | POA: Diagnosis not present

## 2017-10-31 DIAGNOSIS — M47812 Spondylosis without myelopathy or radiculopathy, cervical region: Secondary | ICD-10-CM | POA: Diagnosis not present

## 2017-10-31 DIAGNOSIS — M9901 Segmental and somatic dysfunction of cervical region: Secondary | ICD-10-CM | POA: Diagnosis not present

## 2017-11-04 DIAGNOSIS — M47812 Spondylosis without myelopathy or radiculopathy, cervical region: Secondary | ICD-10-CM | POA: Diagnosis not present

## 2017-11-04 DIAGNOSIS — M9901 Segmental and somatic dysfunction of cervical region: Secondary | ICD-10-CM | POA: Diagnosis not present

## 2017-11-06 DIAGNOSIS — M9901 Segmental and somatic dysfunction of cervical region: Secondary | ICD-10-CM | POA: Diagnosis not present

## 2017-11-06 DIAGNOSIS — M47812 Spondylosis without myelopathy or radiculopathy, cervical region: Secondary | ICD-10-CM | POA: Diagnosis not present

## 2017-11-11 DIAGNOSIS — M47812 Spondylosis without myelopathy or radiculopathy, cervical region: Secondary | ICD-10-CM | POA: Diagnosis not present

## 2017-11-11 DIAGNOSIS — M9901 Segmental and somatic dysfunction of cervical region: Secondary | ICD-10-CM | POA: Diagnosis not present

## 2017-11-13 DIAGNOSIS — M47812 Spondylosis without myelopathy or radiculopathy, cervical region: Secondary | ICD-10-CM | POA: Diagnosis not present

## 2017-11-13 DIAGNOSIS — M9901 Segmental and somatic dysfunction of cervical region: Secondary | ICD-10-CM | POA: Diagnosis not present

## 2017-11-18 DIAGNOSIS — M9901 Segmental and somatic dysfunction of cervical region: Secondary | ICD-10-CM | POA: Diagnosis not present

## 2017-11-18 DIAGNOSIS — M47812 Spondylosis without myelopathy or radiculopathy, cervical region: Secondary | ICD-10-CM | POA: Diagnosis not present

## 2017-11-20 DIAGNOSIS — M9901 Segmental and somatic dysfunction of cervical region: Secondary | ICD-10-CM | POA: Diagnosis not present

## 2017-11-20 DIAGNOSIS — M47812 Spondylosis without myelopathy or radiculopathy, cervical region: Secondary | ICD-10-CM | POA: Diagnosis not present

## 2017-11-25 DIAGNOSIS — M47812 Spondylosis without myelopathy or radiculopathy, cervical region: Secondary | ICD-10-CM | POA: Diagnosis not present

## 2017-11-25 DIAGNOSIS — M9901 Segmental and somatic dysfunction of cervical region: Secondary | ICD-10-CM | POA: Diagnosis not present

## 2017-11-27 DIAGNOSIS — M9901 Segmental and somatic dysfunction of cervical region: Secondary | ICD-10-CM | POA: Diagnosis not present

## 2017-11-27 DIAGNOSIS — M47812 Spondylosis without myelopathy or radiculopathy, cervical region: Secondary | ICD-10-CM | POA: Diagnosis not present

## 2017-12-02 DIAGNOSIS — M47812 Spondylosis without myelopathy or radiculopathy, cervical region: Secondary | ICD-10-CM | POA: Diagnosis not present

## 2017-12-02 DIAGNOSIS — M9901 Segmental and somatic dysfunction of cervical region: Secondary | ICD-10-CM | POA: Diagnosis not present

## 2017-12-04 DIAGNOSIS — M9901 Segmental and somatic dysfunction of cervical region: Secondary | ICD-10-CM | POA: Diagnosis not present

## 2017-12-04 DIAGNOSIS — M47812 Spondylosis without myelopathy or radiculopathy, cervical region: Secondary | ICD-10-CM | POA: Diagnosis not present

## 2017-12-09 DIAGNOSIS — M9901 Segmental and somatic dysfunction of cervical region: Secondary | ICD-10-CM | POA: Diagnosis not present

## 2017-12-09 DIAGNOSIS — M47812 Spondylosis without myelopathy or radiculopathy, cervical region: Secondary | ICD-10-CM | POA: Diagnosis not present

## 2017-12-26 DIAGNOSIS — H903 Sensorineural hearing loss, bilateral: Secondary | ICD-10-CM | POA: Diagnosis not present

## 2018-01-01 DIAGNOSIS — H903 Sensorineural hearing loss, bilateral: Secondary | ICD-10-CM | POA: Diagnosis not present

## 2018-04-09 DIAGNOSIS — E7849 Other hyperlipidemia: Secondary | ICD-10-CM | POA: Diagnosis not present

## 2018-04-09 DIAGNOSIS — Z0000 Encounter for general adult medical examination without abnormal findings: Secondary | ICD-10-CM | POA: Diagnosis not present

## 2018-04-09 DIAGNOSIS — Z125 Encounter for screening for malignant neoplasm of prostate: Secondary | ICD-10-CM | POA: Diagnosis not present

## 2018-04-09 DIAGNOSIS — R82998 Other abnormal findings in urine: Secondary | ICD-10-CM | POA: Diagnosis not present

## 2018-04-16 DIAGNOSIS — K635 Polyp of colon: Secondary | ICD-10-CM | POA: Diagnosis not present

## 2018-04-16 DIAGNOSIS — R030 Elevated blood-pressure reading, without diagnosis of hypertension: Secondary | ICD-10-CM | POA: Diagnosis not present

## 2018-04-16 DIAGNOSIS — E7849 Other hyperlipidemia: Secondary | ICD-10-CM | POA: Diagnosis not present

## 2018-04-16 DIAGNOSIS — Z6824 Body mass index (BMI) 24.0-24.9, adult: Secondary | ICD-10-CM | POA: Diagnosis not present

## 2018-04-16 DIAGNOSIS — M1990 Unspecified osteoarthritis, unspecified site: Secondary | ICD-10-CM | POA: Diagnosis not present

## 2018-04-16 DIAGNOSIS — K219 Gastro-esophageal reflux disease without esophagitis: Secondary | ICD-10-CM | POA: Diagnosis not present

## 2018-04-16 DIAGNOSIS — Z1389 Encounter for screening for other disorder: Secondary | ICD-10-CM | POA: Diagnosis not present

## 2018-04-16 DIAGNOSIS — Z0000 Encounter for general adult medical examination without abnormal findings: Secondary | ICD-10-CM | POA: Diagnosis not present

## 2018-04-16 DIAGNOSIS — M545 Low back pain: Secondary | ICD-10-CM | POA: Diagnosis not present

## 2018-04-18 DIAGNOSIS — Z1212 Encounter for screening for malignant neoplasm of rectum: Secondary | ICD-10-CM | POA: Diagnosis not present

## 2018-04-29 ENCOUNTER — Other Ambulatory Visit: Payer: Self-pay | Admitting: Internal Medicine

## 2018-04-29 DIAGNOSIS — M545 Low back pain, unspecified: Secondary | ICD-10-CM

## 2018-04-30 ENCOUNTER — Ambulatory Visit
Admission: RE | Admit: 2018-04-30 | Discharge: 2018-04-30 | Disposition: A | Discharge status: Home / Self Care | Payer: PPO | Source: Ambulatory Visit | Attending: Internal Medicine | Admitting: Internal Medicine

## 2018-04-30 DIAGNOSIS — M545 Low back pain, unspecified: Secondary | ICD-10-CM

## 2018-04-30 DIAGNOSIS — M48061 Spinal stenosis, lumbar region without neurogenic claudication: Secondary | ICD-10-CM | POA: Diagnosis not present

## 2018-05-08 DIAGNOSIS — Z6826 Body mass index (BMI) 26.0-26.9, adult: Secondary | ICD-10-CM | POA: Diagnosis not present

## 2018-05-08 DIAGNOSIS — M4316 Spondylolisthesis, lumbar region: Secondary | ICD-10-CM | POA: Diagnosis not present

## 2018-05-08 DIAGNOSIS — R030 Elevated blood-pressure reading, without diagnosis of hypertension: Secondary | ICD-10-CM | POA: Diagnosis not present

## 2018-05-16 DIAGNOSIS — M5416 Radiculopathy, lumbar region: Secondary | ICD-10-CM | POA: Diagnosis not present

## 2018-05-16 DIAGNOSIS — M5116 Intervertebral disc disorders with radiculopathy, lumbar region: Secondary | ICD-10-CM | POA: Diagnosis not present

## 2018-07-11 ENCOUNTER — Other Ambulatory Visit: Payer: Self-pay | Admitting: Neurological Surgery

## 2018-07-11 ENCOUNTER — Other Ambulatory Visit (HOSPITAL_COMMUNITY): Payer: Self-pay | Admitting: Neurological Surgery

## 2018-07-11 DIAGNOSIS — M4316 Spondylolisthesis, lumbar region: Secondary | ICD-10-CM

## 2018-07-17 ENCOUNTER — Ambulatory Visit (HOSPITAL_COMMUNITY)
Admission: RE | Admit: 2018-07-17 | Discharge: 2018-07-17 | Disposition: A | Discharge status: Home / Self Care | Payer: PPO | Source: Ambulatory Visit | Attending: Neurological Surgery | Admitting: Neurological Surgery

## 2018-07-17 DIAGNOSIS — M4316 Spondylolisthesis, lumbar region: Secondary | ICD-10-CM | POA: Insufficient documentation

## 2018-07-17 DIAGNOSIS — M48061 Spinal stenosis, lumbar region without neurogenic claudication: Secondary | ICD-10-CM | POA: Diagnosis not present

## 2018-07-17 DIAGNOSIS — M5126 Other intervertebral disc displacement, lumbar region: Secondary | ICD-10-CM | POA: Insufficient documentation

## 2018-07-21 NOTE — Pre-Procedure Instructions (Signed)
Harve Spradley Mankey  07/21/2018      [id number] - [place], [place] - [address] [place] [address] Phone: [phone] Fax: [phone]  [id number] - [place], [address] [place] [address] Phone: [phone] Fax: [phone]    Your procedure is scheduled on July 29, 2018.  Report to [facility] Admitting at 845 AM.  Call this number if you have problems the morning of surgery:  [phone]   Remember:  Do not eat or drink after midnight.    Take these medicines the morning of surgery with A SIP OF WATER  Tylenol-if needed omeprazole (prilosec)  Follow your surgeon's instructions on when to hold/resume aspirin.  If no instructions were given call the office to determine how they would like to you take aspirin  7 days prior to surgery STOP taking any Aleve, Naproxen, Ibuprofen, Motrin, Advil, Goody's, BC's, all herbal medications, fish oil, and all vitamins    Do not wear jewelry  Do not wear lotions, powders, or colognes, or deodorant.  Men may shave face and neck.  Do not bring valuables to the hospital.  [facility] is not responsible for any belongings or valuables.  Contacts, dentures or bridgework may not be worn into surgery.  Leave your suitcase in the car.  After surgery it may be brought to your room.  For patients admitted to the hospital, discharge time will be determined by your treatment team.  Patients discharged the day of surgery will not be allowed to drive home.    [facility]- Preparing For Surgery  Before surgery, you can play an important role. Because skin is not sterile, your skin needs to be as free of germs as possible. You can reduce the number of germs on your skin by washing with CHG (chlorahexidine gluconate) Soap before surgery.  CHG is an antiseptic cleaner which kills germs and bonds with the skin  to continue killing germs even after washing.    Oral Hygiene is also important to reduce your risk of infection.  Remember - BRUSH YOUR TEETH THE MORNING OF SURGERY WITH YOUR REGULAR TOOTHPASTE  Please do not use if you have an allergy to CHG or antibacterial soaps. If your skin becomes reddened/irritated stop using the CHG.  Do not shave (including legs and underarms) for at least 48 hours prior to first CHG shower. It is OK to shave your face.  Please follow these instructions carefully.   1. Shower the NIGHT BEFORE SURGERY and the MORNING OF SURGERY with CHG.   2. If you chose to wash your hair, wash your hair first as usual with your normal shampoo.  3. After you shampoo, rinse your hair and body thoroughly to remove the shampoo.  4. Use CHG as you would any other liquid soap. You can apply CHG directly to the skin and wash gently with a scrungie or a clean washcloth.   5. Apply the CHG Soap to your body ONLY FROM THE NECK DOWN.  Do not use on open wounds or open sores. Avoid contact with your eyes, ears, mouth and genitals (private parts). Wash Face and genitals (private parts)  with your normal soap.  6. Wash thoroughly, paying special attention to the area where your surgery will be performed.  7. Thoroughly rinse your body with warm water from the neck down.  8. DO NOT shower/wash with your normal soap after  using and rinsing off the CHG Soap.  9. Pat yourself dry with a CLEAN TOWEL.  10. Wear CLEAN PAJAMAS to bed the night before surgery, wear comfortable clothes the morning of surgery  11. Place CLEAN SHEETS on your bed the night of your first shower and DO NOT SLEEP WITH PETS.  Day of Surgery:  Do not apply any deodorants/lotions.  Please wear clean clothes to the hospital/surgery center.   Remember to brush your teeth WITH YOUR REGULAR TOOTHPASTE.  Please read over the following fact sheets that you were given. Pain Booklet, Coughing and Deep Breathing, MRSA  Information and Surgical Site Infection Prevention

## 2018-07-22 ENCOUNTER — Encounter (HOSPITAL_COMMUNITY): Payer: Self-pay

## 2018-07-22 ENCOUNTER — Encounter (HOSPITAL_COMMUNITY)
Admission: RE | Admit: 2018-07-22 | Discharge: 2018-07-22 | Disposition: A | Discharge status: Home / Self Care | Payer: PPO | Source: Ambulatory Visit | Attending: Neurological Surgery | Admitting: Neurological Surgery

## 2018-07-22 ENCOUNTER — Other Ambulatory Visit: Payer: Self-pay

## 2018-07-22 DIAGNOSIS — Z01812 Encounter for preprocedural laboratory examination: Secondary | ICD-10-CM | POA: Insufficient documentation

## 2018-07-22 LAB — ABO/RH: ABO/RH(D): A POS

## 2018-07-22 LAB — CBC
HEMATOCRIT: 53.2 % — AB (ref 39.0–52.0)
HEMOGLOBIN: 17 g/dL (ref 13.0–17.0)
MCH: 30 pg (ref 26.0–34.0)
MCHC: 32 g/dL (ref 30.0–36.0)
MCV: 94 fL (ref 80.0–100.0)
NRBC: 0 % (ref 0.0–0.2)
PLATELETS: 222 10*3/uL (ref 150–400)
RBC: 5.66 MIL/uL (ref 4.22–5.81)
RDW: 13.1 % (ref 11.5–15.5)
WBC: 7.2 10*3/uL (ref 4.0–10.5)

## 2018-07-22 LAB — TYPE AND SCREEN
ABO/RH(D): A POS
ANTIBODY SCREEN: NEGATIVE

## 2018-07-22 LAB — SURGICAL PCR SCREEN
MRSA, PCR: NEGATIVE
Staphylococcus aureus: NEGATIVE

## 2018-07-22 NOTE — Progress Notes (Signed)
PCP - Dr, Prince Solian Cardiologist - in the past, Dr. Martinique  Chest x-ray - N/A EKG - N/A Stress Test - 07/01/15 ECHO - denies Cardiac Cath - 07/13/15  Sleep Study - denies  Aspirin Instructions: Per pt, "I was instructed to stop my ASA today". LD 07/22/18-7 days prior to surgery.   Anesthesia review: No  Patient denies shortness of breath, fever, cough and chest pain at PAT appointment   Patient verbalized understanding of instructions that were given to them at the PAT appointment. Patient was also instructed that they will need to review over the PAT instructions again at home before surgery.

## 2018-07-29 ENCOUNTER — Inpatient Hospital Stay (HOSPITAL_COMMUNITY): Payer: PPO

## 2018-07-29 ENCOUNTER — Encounter (HOSPITAL_COMMUNITY): Payer: Self-pay | Admitting: *Deleted

## 2018-07-29 ENCOUNTER — Encounter (HOSPITAL_COMMUNITY)
Admission: RE | Disposition: A | Discharge status: Home / Self Care | Payer: Self-pay | Source: Ambulatory Visit | Attending: Neurological Surgery

## 2018-07-29 ENCOUNTER — Inpatient Hospital Stay (HOSPITAL_COMMUNITY)
Admission: RE | Admit: 2018-07-29 | Discharge: 2018-07-31 | DRG: 455 | Disposition: A | Discharge status: Home / Self Care | Payer: PPO | Source: Ambulatory Visit | Attending: Neurological Surgery | Admitting: Neurological Surgery

## 2018-07-29 ENCOUNTER — Other Ambulatory Visit: Payer: Self-pay

## 2018-07-29 DIAGNOSIS — M5416 Radiculopathy, lumbar region: Secondary | ICD-10-CM | POA: Diagnosis not present

## 2018-07-29 DIAGNOSIS — M4316 Spondylolisthesis, lumbar region: Principal | ICD-10-CM | POA: Diagnosis present

## 2018-07-29 DIAGNOSIS — Z419 Encounter for procedure for purposes other than remedying health state, unspecified: Secondary | ICD-10-CM

## 2018-07-29 DIAGNOSIS — M4326 Fusion of spine, lumbar region: Secondary | ICD-10-CM | POA: Diagnosis not present

## 2018-07-29 DIAGNOSIS — M48061 Spinal stenosis, lumbar region without neurogenic claudication: Secondary | ICD-10-CM | POA: Diagnosis not present

## 2018-07-29 SURGERY — POSTERIOR LUMBAR FUSION 1 LEVEL
Anesthesia: General | Site: Back

## 2018-07-29 MED ORDER — MORPHINE SULFATE (PF) 2 MG/ML IV SOLN: 2.0000 mg | INTRAVENOUS | Status: DC | PRN

## 2018-07-29 MED ORDER — ONDANSETRON HCL 4 MG PO TABS: 4.0000 mg | ORAL_TABLET | Freq: Four times a day (QID) | ORAL | Status: DC | PRN

## 2018-07-29 MED ORDER — METHOCARBAMOL 1000 MG/10ML IJ SOLN
500.0000 mg | Freq: Four times a day (QID) | INTRAVENOUS | Status: DC | PRN
  Filled 2018-07-29: qty 5

## 2018-07-29 MED ORDER — LACTATED RINGERS IV SOLN
INTRAVENOUS | Status: DC
  Administered 2018-07-29 – 2018-07-30 (×2): via INTRAVENOUS

## 2018-07-29 MED ORDER — FLEET ENEMA 7-19 GM/118ML RE ENEM: 1.0000 | ENEMA | Freq: Once | RECTAL | Status: DC | PRN

## 2018-07-29 MED ORDER — ONDANSETRON HCL 4 MG/2ML IJ SOLN
INTRAMUSCULAR | Status: DC | PRN
  Administered 2018-07-29: 4 mg via INTRAVENOUS

## 2018-07-29 MED ORDER — FENTANYL CITRATE (PF) 100 MCG/2ML IJ SOLN
25.0000 ug | INTRAMUSCULAR | Status: DC | PRN
  Administered 2018-07-29: 50 ug via INTRAVENOUS

## 2018-07-29 MED ORDER — OXYCODONE HCL 5 MG PO TABS: 5.0000 mg | ORAL_TABLET | Freq: Once | ORAL | Status: DC | PRN

## 2018-07-29 MED ORDER — PROPOFOL 10 MG/ML IV BOLUS
INTRAVENOUS | Status: AC
  Filled 2018-07-29: qty 20

## 2018-07-29 MED ORDER — CEFAZOLIN SODIUM-DEXTROSE 2-4 GM/100ML-% IV SOLN
2.0000 g | Freq: Three times a day (TID) | INTRAVENOUS | Status: AC
  Administered 2018-07-29 – 2018-07-30 (×2): 2 g via INTRAVENOUS
  Filled 2018-07-29 (×2): qty 100

## 2018-07-29 MED ORDER — BUPIVACAINE HCL (PF) 0.5 % IJ SOLN
INTRAMUSCULAR | Status: AC
  Filled 2018-07-29: qty 30

## 2018-07-29 MED ORDER — PROPOFOL 10 MG/ML IV BOLUS
INTRAVENOUS | Status: DC | PRN
  Administered 2018-07-29: 20 mg via INTRAVENOUS
  Administered 2018-07-29: 30 mg via INTRAVENOUS
  Administered 2018-07-29: 110 mg via INTRAVENOUS

## 2018-07-29 MED ORDER — CHLORHEXIDINE GLUCONATE CLOTH 2 % EX PADS: 6.0000 | MEDICATED_PAD | Freq: Once | CUTANEOUS | Status: DC

## 2018-07-29 MED ORDER — PANTOPRAZOLE SODIUM 40 MG PO TBEC: 40.0000 mg | DELAYED_RELEASE_TABLET | Freq: Every day | ORAL | Status: DC

## 2018-07-29 MED ORDER — ACETAMINOPHEN 500 MG PO TABS: 1000.0000 mg | ORAL_TABLET | Freq: Once | ORAL | Status: DC | PRN

## 2018-07-29 MED ORDER — ACETAMINOPHEN 500 MG PO TABS: 500.0000 mg | ORAL_TABLET | Freq: Four times a day (QID) | ORAL | Status: DC | PRN

## 2018-07-29 MED ORDER — BUPIVACAINE HCL (PF) 0.5 % IJ SOLN
INTRAMUSCULAR | Status: DC | PRN
  Administered 2018-07-29: 4 mL

## 2018-07-29 MED ORDER — FENTANYL CITRATE (PF) 100 MCG/2ML IJ SOLN
INTRAMUSCULAR | Status: AC
  Filled 2018-07-29: qty 2

## 2018-07-29 MED ORDER — ALUM & MAG HYDROXIDE-SIMETH 200-200-20 MG/5ML PO SUSP: 30.0000 mL | Freq: Four times a day (QID) | ORAL | Status: DC | PRN

## 2018-07-29 MED ORDER — KETOROLAC TROMETHAMINE 15 MG/ML IJ SOLN
7.5000 mg | Freq: Four times a day (QID) | INTRAMUSCULAR | Status: AC
  Administered 2018-07-29 – 2018-07-30 (×5): 7.5 mg via INTRAVENOUS
  Filled 2018-07-29 (×5): qty 1

## 2018-07-29 MED ORDER — EPHEDRINE 5 MG/ML INJ
INTRAVENOUS | Status: AC
  Filled 2018-07-29: qty 10

## 2018-07-29 MED ORDER — SODIUM CHLORIDE 0.9 % IV SOLN: 250.0000 mL | INTRAVENOUS | Status: DC

## 2018-07-29 MED ORDER — PANTOPRAZOLE SODIUM 40 MG PO TBEC
40.0000 mg | DELAYED_RELEASE_TABLET | Freq: Every day | ORAL | Status: DC
  Administered 2018-07-30 – 2018-07-31 (×2): 40 mg via ORAL
  Filled 2018-07-29 (×2): qty 1

## 2018-07-29 MED ORDER — 0.9 % SODIUM CHLORIDE (POUR BTL) OPTIME
TOPICAL | Status: DC | PRN
  Administered 2018-07-29: 1000 mL

## 2018-07-29 MED ORDER — THROMBIN 5000 UNITS EX SOLR
CUTANEOUS | Status: AC
  Filled 2018-07-29: qty 5000

## 2018-07-29 MED ORDER — ACETAMINOPHEN 650 MG RE SUPP: 650.0000 mg | RECTAL | Status: DC | PRN

## 2018-07-29 MED ORDER — DEXAMETHASONE SODIUM PHOSPHATE 10 MG/ML IJ SOLN
INTRAMUSCULAR | Status: DC | PRN
  Administered 2018-07-29: 10 mg via INTRAVENOUS

## 2018-07-29 MED ORDER — DOCUSATE SODIUM 100 MG PO CAPS
100.0000 mg | ORAL_CAPSULE | Freq: Two times a day (BID) | ORAL | Status: DC
  Administered 2018-07-29 – 2018-07-31 (×4): 100 mg via ORAL
  Filled 2018-07-29 (×4): qty 1

## 2018-07-29 MED ORDER — METHOCARBAMOL 500 MG PO TABS
500.0000 mg | ORAL_TABLET | Freq: Four times a day (QID) | ORAL | Status: DC | PRN
  Administered 2018-07-31: 500 mg via ORAL
  Filled 2018-07-29: qty 1

## 2018-07-29 MED ORDER — PROPOFOL 500 MG/50ML IV EMUL
INTRAVENOUS | Status: DC | PRN
  Administered 2018-07-29: 15 ug/kg/min via INTRAVENOUS

## 2018-07-29 MED ORDER — THROMBIN 5000 UNITS EX SOLR
OROMUCOSAL | Status: DC | PRN
  Administered 2018-07-29: 5 mL via TOPICAL

## 2018-07-29 MED ORDER — LIDOCAINE-EPINEPHRINE 1 %-1:100000 IJ SOLN
INTRAMUSCULAR | Status: AC
  Filled 2018-07-29: qty 1

## 2018-07-29 MED ORDER — FENTANYL CITRATE (PF) 250 MCG/5ML IJ SOLN
INTRAMUSCULAR | Status: AC
  Filled 2018-07-29: qty 5

## 2018-07-29 MED ORDER — SUCCINYLCHOLINE CHLORIDE 20 MG/ML IJ SOLN
INTRAMUSCULAR | Status: DC | PRN
  Administered 2018-07-29: 50 mg via INTRAVENOUS

## 2018-07-29 MED ORDER — EPHEDRINE SULFATE 50 MG/ML IJ SOLN
INTRAMUSCULAR | Status: DC | PRN
  Administered 2018-07-29 (×2): 10 mg via INTRAVENOUS

## 2018-07-29 MED ORDER — SENNA 8.6 MG PO TABS
1.0000 | ORAL_TABLET | Freq: Two times a day (BID) | ORAL | Status: DC
  Administered 2018-07-29 – 2018-07-31 (×4): 8.6 mg via ORAL
  Filled 2018-07-29 (×4): qty 1

## 2018-07-29 MED ORDER — LACTATED RINGERS IV SOLN
INTRAVENOUS | Status: DC | PRN
  Administered 2018-07-29 (×2): via INTRAVENOUS

## 2018-07-29 MED ORDER — HYDROCODONE-ACETAMINOPHEN 5-325 MG PO TABS
1.0000 | ORAL_TABLET | ORAL | Status: DC | PRN
  Administered 2018-07-29 – 2018-07-31 (×5): 2 via ORAL
  Filled 2018-07-29 (×5): qty 2

## 2018-07-29 MED ORDER — OXYCODONE HCL 5 MG/5ML PO SOLN: 5.0000 mg | Freq: Once | ORAL | Status: DC | PRN

## 2018-07-29 MED ORDER — ACETAMINOPHEN 160 MG/5ML PO SOLN: 1000.0000 mg | Freq: Once | ORAL | Status: DC | PRN

## 2018-07-29 MED ORDER — SODIUM CHLORIDE 0.9 % IV SOLN
INTRAVENOUS | Status: DC | PRN
  Administered 2018-07-29: 50 ug/min via INTRAVENOUS

## 2018-07-29 MED ORDER — BISACODYL 10 MG RE SUPP: 10.0000 mg | Freq: Every day | RECTAL | Status: DC | PRN

## 2018-07-29 MED ORDER — ONDANSETRON HCL 4 MG/2ML IJ SOLN
4.0000 mg | Freq: Four times a day (QID) | INTRAMUSCULAR | Status: DC | PRN
  Administered 2018-07-29 – 2018-07-30 (×2): 4 mg via INTRAVENOUS
  Filled 2018-07-29 (×2): qty 2

## 2018-07-29 MED ORDER — ROCURONIUM BROMIDE 50 MG/5ML IV SOSY
PREFILLED_SYRINGE | INTRAVENOUS | Status: DC | PRN
  Administered 2018-07-29: 10 mg via INTRAVENOUS
  Administered 2018-07-29: 30 mg via INTRAVENOUS
  Administered 2018-07-29: 20 mg via INTRAVENOUS

## 2018-07-29 MED ORDER — SODIUM CHLORIDE 0.9% FLUSH
3.0000 mL | Freq: Two times a day (BID) | INTRAVENOUS | Status: DC
  Administered 2018-07-30 – 2018-07-31 (×2): 3 mL via INTRAVENOUS

## 2018-07-29 MED ORDER — PHENOL 1.4 % MT LIQD: 1.0000 | OROMUCOSAL | Status: DC | PRN

## 2018-07-29 MED ORDER — SUGAMMADEX SODIUM 200 MG/2ML IV SOLN
INTRAVENOUS | Status: DC | PRN
  Administered 2018-07-29: 200 mg via INTRAVENOUS

## 2018-07-29 MED ORDER — MENTHOL 3 MG MT LOZG: 1.0000 | LOZENGE | OROMUCOSAL | Status: DC | PRN

## 2018-07-29 MED ORDER — THROMBIN (RECOMBINANT) 20000 UNITS EX SOLR
CUTANEOUS | Status: AC
  Filled 2018-07-29: qty 20000

## 2018-07-29 MED ORDER — SODIUM CHLORIDE 0.9% FLUSH: 3.0000 mL | INTRAVENOUS | Status: DC | PRN

## 2018-07-29 MED ORDER — LACTATED RINGERS IV SOLN
INTRAVENOUS | Status: DC
  Administered 2018-07-29: 10:00:00 via INTRAVENOUS

## 2018-07-29 MED ORDER — ACETAMINOPHEN 325 MG PO TABS: 650.0000 mg | ORAL_TABLET | ORAL | Status: DC | PRN

## 2018-07-29 MED ORDER — LIDOCAINE 2% (20 MG/ML) 5 ML SYRINGE
INTRAMUSCULAR | Status: DC | PRN
  Administered 2018-07-29: 60 mg via INTRAVENOUS

## 2018-07-29 MED ORDER — ARTIFICIAL TEARS OPHTHALMIC OINT
TOPICAL_OINTMENT | OPHTHALMIC | Status: DC | PRN
  Administered 2018-07-29: 1 via OPHTHALMIC

## 2018-07-29 MED ORDER — ACETAMINOPHEN 10 MG/ML IV SOLN: 1000.0000 mg | Freq: Once | INTRAVENOUS | Status: DC | PRN

## 2018-07-29 MED ORDER — CEFAZOLIN SODIUM-DEXTROSE 2-4 GM/100ML-% IV SOLN
2.0000 g | INTRAVENOUS | Status: AC
  Administered 2018-07-29: 2 g via INTRAVENOUS
  Filled 2018-07-29: qty 100

## 2018-07-29 MED ORDER — FENTANYL CITRATE (PF) 100 MCG/2ML IJ SOLN
INTRAMUSCULAR | Status: DC | PRN
  Administered 2018-07-29 (×2): 100 ug via INTRAVENOUS
  Administered 2018-07-29 (×2): 50 ug via INTRAVENOUS

## 2018-07-29 MED ORDER — LIDOCAINE-EPINEPHRINE 1 %-1:100000 IJ SOLN
INTRAMUSCULAR | Status: DC | PRN
  Administered 2018-07-29: 4 mL

## 2018-07-29 MED ORDER — SODIUM CHLORIDE 0.9 % IV SOLN
INTRAVENOUS | Status: DC | PRN
  Administered 2018-07-29: 500 mL

## 2018-07-29 MED ORDER — POLYETHYLENE GLYCOL 3350 17 G PO PACK: 17.0000 g | PACK | Freq: Every day | ORAL | Status: DC | PRN

## 2018-07-29 SURGICAL SUPPLY — 90 items
ADH SKN CLS APL DERMABOND .7 (GAUZE/BANDAGES/DRESSINGS) ×2
APL SKNCLS STERI-STRIP NONHPOA (GAUZE/BANDAGES/DRESSINGS)
BAG DECANTER FOR FLEXI CONT (MISCELLANEOUS) ×3 IMPLANT
BASKET BONE COLLECTION (BASKET) ×2 IMPLANT
BENZOIN TINCTURE PRP APPL 2/3 (GAUZE/BANDAGES/DRESSINGS) ×1 IMPLANT
BIT DRILL LONG 3.0X30 (BIT) IMPLANT
BIT DRILL LONG 3X80 (BIT) IMPLANT
BIT DRILL LONG 4X80 (BIT) IMPLANT
BIT DRILL SHORT 3.0X30 (BIT) IMPLANT
BIT DRILL SHORT 3X80 (BIT) IMPLANT
BLADE CLIPPER SURG (BLADE) IMPLANT
BLADE SURG 11 STRL SS (BLADE) ×1 IMPLANT
BONE CANC CHIPS 20CC PCAN1/4 (Bone Implant) ×3 IMPLANT
BUR MATCHSTICK NEURO 3.0 LAGG (BURR) ×2 IMPLANT
CAGE COROENT LG 10X9X23-12 (Cage) ×4 IMPLANT
CANISTER SUCT 3000ML (MISCELLANEOUS) ×2 IMPLANT
CARTRIDGE OIL MAESTRO DRILL (MISCELLANEOUS) ×1 IMPLANT
CHIPS CANC BONE 20CC PCAN1/4 (Bone Implant) ×2 IMPLANT
CONT SPEC 4OZ CLIKSEAL STRL BL (MISCELLANEOUS) ×3 IMPLANT
COVER BACK TABLE 24X17X13 BIG (DRAPES) IMPLANT
COVER BACK TABLE 60X90IN (DRAPES) ×3 IMPLANT
COVER WAND RF STERILE (DRAPES) ×3 IMPLANT
DERMABOND ADVANCED (GAUZE/BANDAGES/DRESSINGS) ×1
DERMABOND ADVANCED .7 DNX12 (GAUZE/BANDAGES/DRESSINGS) ×4 IMPLANT
DEVICE DISSECT PLASMABLAD 3.0S (MISCELLANEOUS) ×1 IMPLANT
DIFFUSER DRILL AIR PNEUMATIC (MISCELLANEOUS) ×1 IMPLANT
DRAPE C-ARM 42X72 X-RAY (DRAPES) ×4 IMPLANT
DRAPE C-ARMOR (DRAPES) ×4 IMPLANT
DRAPE LAPAROTOMY 100X72X124 (DRAPES) ×4 IMPLANT
DRAPE POUCH INSTRU U-SHP 10X18 (DRAPES) ×4 IMPLANT
DRAPE SHEET LG 3/4 BI-LAMINATE (DRAPES) ×1 IMPLANT
DRAPE SURG 17X23 STRL (DRAPES) ×1 IMPLANT
DURAPREP 26ML APPLICATOR (WOUND CARE) ×4 IMPLANT
ELECT BLADE 4.0 EZ CLEAN MEGAD (MISCELLANEOUS)
ELECT REM PT RETURN 9FT ADLT (ELECTROSURGICAL) ×3
ELECTRODE BLDE 4.0 EZ CLN MEGD (MISCELLANEOUS) IMPLANT
ELECTRODE REM PT RTRN 9FT ADLT (ELECTROSURGICAL) ×3 IMPLANT
GAUZE 4X4 16PLY RFD (DISPOSABLE) IMPLANT
GAUZE SPONGE 4X4 12PLY STRL (GAUZE/BANDAGES/DRESSINGS) ×1 IMPLANT
GLOVE BIOGEL PI IND STRL 6.5 (GLOVE) ×1 IMPLANT
GLOVE BIOGEL PI IND STRL 8.5 (GLOVE) ×5 IMPLANT
GLOVE BIOGEL PI INDICATOR 6.5 (GLOVE) ×1
GLOVE BIOGEL PI INDICATOR 8.5 (GLOVE) ×2
GLOVE ECLIPSE 8.5 STRL (GLOVE) ×7 IMPLANT
GLOVE EXAM NITRILE LRG STRL (GLOVE) IMPLANT
GLOVE EXAM NITRILE XL STR (GLOVE) IMPLANT
GLOVE EXAM NITRILE XS STR PU (GLOVE) IMPLANT
GLOVE SURG SS PI 6.0 STRL IVOR (GLOVE) ×6 IMPLANT
GLOVE SURG SS PI 7.0 STRL IVOR (GLOVE) ×6 IMPLANT
GLOVE SURG SS PI 7.5 STRL IVOR (GLOVE) ×8 IMPLANT
GOWN STRL REUS W/ TWL LRG LVL3 (GOWN DISPOSABLE) ×4 IMPLANT
GOWN STRL REUS W/ TWL XL LVL3 (GOWN DISPOSABLE) ×3 IMPLANT
GOWN STRL REUS W/TWL 2XL LVL3 (GOWN DISPOSABLE) ×8 IMPLANT
GOWN STRL REUS W/TWL LRG LVL3 (GOWN DISPOSABLE) ×12
GOWN STRL REUS W/TWL XL LVL3 (GOWN DISPOSABLE)
GRAFT BNE CANC CHIPS 1-8 20CC (Bone Implant) IMPLANT
HEMOSTAT POWDER KIT SURGIFOAM (HEMOSTASIS) ×2 IMPLANT
KIT BASIN OR (CUSTOM PROCEDURE TRAY) ×4 IMPLANT
KIT INFUSE X SMALL 1.4CC (Orthopedic Implant) ×2 IMPLANT
KIT SPINE MAZOR X ROBO DISP (MISCELLANEOUS) ×1 IMPLANT
KIT TURNOVER KIT B (KITS) ×4 IMPLANT
MARKER SKIN DUAL TIP RULER LAB (MISCELLANEOUS) ×3 IMPLANT
MILL MEDIUM DISP (BLADE) ×2 IMPLANT
NDL HYPO 25X1 1.5 SAFETY (NEEDLE) ×2 IMPLANT
NEEDLE HYPO 25X1 1.5 SAFETY (NEEDLE) ×3 IMPLANT
NS IRRIG 1000ML POUR BTL (IV SOLUTION) ×4 IMPLANT
OIL CARTRIDGE MAESTRO DRILL (MISCELLANEOUS)
PACK LAMINECTOMY NEURO (CUSTOM PROCEDURE TRAY) ×4 IMPLANT
PAD ARMBOARD 7.5X6 YLW CONV (MISCELLANEOUS) ×9 IMPLANT
PATTIES SURGICAL .5 X.5 (GAUZE/BANDAGES/DRESSINGS) IMPLANT
PATTIES SURGICAL .5 X1 (DISPOSABLE) ×2 IMPLANT
PATTIES SURGICAL 1X1 (DISPOSABLE) IMPLANT
PIN HEAD 2.5X60MM (PIN) IMPLANT
PLASMABLADE 3.0S (MISCELLANEOUS) ×3
ROD RELINE-O LORD 5.5X35MM (Rod) ×4 IMPLANT
SCREW LOCK RELINE 5.5 TULIP (Screw) ×8 IMPLANT
SCREW RELINE-O POLY 6.5X45 (Screw) ×8 IMPLANT
SCREW SCHANZ SA 4.0MM (MISCELLANEOUS) IMPLANT
SPONGE LAP 4X18 RFD (DISPOSABLE) IMPLANT
STRIP CLOSURE SKIN 1/2X4 (GAUZE/BANDAGES/DRESSINGS) ×1 IMPLANT
SUT VIC AB 1 CT1 18XBRD ANBCTR (SUTURE) ×4 IMPLANT
SUT VIC AB 1 CT1 8-18 (SUTURE) ×6
SUT VIC AB 2-0 CP2 18 (SUTURE) ×7 IMPLANT
SUT VIC AB 3-0 SH 8-18 (SUTURE) ×7 IMPLANT
SYR 3ML LL SCALE MARK (SYRINGE) ×8 IMPLANT
TOWEL GREEN STERILE (TOWEL DISPOSABLE) ×4 IMPLANT
TOWEL GREEN STERILE FF (TOWEL DISPOSABLE) ×4 IMPLANT
TRAY FOLEY MTR SLVR 16FR STAT (SET/KITS/TRAYS/PACK) ×4 IMPLANT
TUBE MAZOR SA REDUCTION (TUBING) ×1 IMPLANT
WATER STERILE IRR 1000ML POUR (IV SOLUTION) ×4 IMPLANT

## 2018-07-29 NOTE — Plan of Care (Signed)
  Problem: Education: Goal: Knowledge of General Education information will improve Description: Including pain rating scale, medication(s)/side effects and non-pharmacologic comfort measures Outcome: Progressing   Problem: Health Behavior/Discharge Planning: Goal: Ability to manage health-related needs will improve Outcome: Progressing   Problem: Clinical Measurements: Goal: Ability to maintain clinical measurements within normal limits will improve Outcome: Progressing   Problem: Clinical Measurements: Goal: Will remain free from infection Outcome: Progressing   Problem: Clinical Measurements: Goal: Diagnostic test results will improve Outcome: Progressing   Problem: Clinical Measurements: Goal: Respiratory complications will improve Outcome: Progressing   Problem: Clinical Measurements: Goal: Cardiovascular complication will be avoided Outcome: Progressing   Problem: Activity: Goal: Risk for activity intolerance will decrease Outcome: Progressing   Problem: Nutrition: Goal: Adequate nutrition will be maintained Outcome: Progressing   

## 2018-07-29 NOTE — Transfer of Care (Signed)
Immediate Anesthesia Transfer of Care Note  Patient: Carl Moody  Procedure(s) Performed: Lumbar four-five Posterior Lumbar Interbody Fusion (N/A Back)  Patient Location: PACU  Anesthesia Type:General  Level of Consciousness: sedated  Airway & Oxygen Therapy: Patient Spontanous Breathing and Patient connected to nasal cannula oxygen  Post-op Assessment: Report given to RN and Post -op Vital signs reviewed and stable  Post vital signs: Reviewed and stable  Last Vitals:  Vitals Value Taken Time  BP 126/76 07/29/2018  2:30 PM  Temp    Pulse 86 07/29/2018  2:31 PM  Resp 19 07/29/2018  2:31 PM  SpO2 95 % 07/29/2018  2:31 PM  Vitals shown include unvalidated device data.  Last Pain:  Vitals:   07/29/18 0936  TempSrc:   PainSc: 5          Complications: No apparent anesthesia complications

## 2018-07-29 NOTE — Op Note (Signed)
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  Asst.: Consuella Lose MD.  Indications: Patient is Carl Moody is a 77 y.o. male who who's had significant back pain and lumbar radiculopathy for over a years period time. A lumbar myelogram demonstrates advanced spondylolisthesis with high-grade canal stenosis. he was advised regarding surgical intervention.  Procedure: The patient was brought to the operating room supine on a stretcher. After the smooth induction of general endotracheal anesthesia she was turned prone and the back was prepped with alcohol and DuraPrep. The back was then draped sterilely. A midline incision was created and carried down to the lumbar dorsal fascia. A localizing radiograph identified the L4 and L5 spinous processes. A subligamentous dissection was created at L4-5  to expose the interlaminar space at L4 and L5 and the facet joints over the L4-5 interspace. Laminotomies were were then created removing the entire inferior margin of the lamina of 4 including the inferior facet at the L4-5 joint. The yellow ligament was taken up and the common dural tube was exposed along with the L4 nerve root superiorly, and the L5 nerve root inferiorly, the disc space was exposed and epidural veins in this region were cauterized and divided. The L4 nerve roots and the L5 nerve root were dissected with care taken to protect them. The disc space was opened and a combination of curettes and rongeurs was used to evacuate the disc space fully. The endplates were removed using sharp curettes. An interbody spacer was placed to  distract the disc space while the contralateral discectomy was performed. When the entirety of the disc was removed and the endplates were prepared final sizing of the disc space was obtained 12 mm 12 degree lordosis peek spacers were chosen and packed with autograft and allograft and placed into the interspace. The remainder of the interspace was packed with autograft and allograft. Pedicle entry sites were then chosen using fluoroscopic guidance and 6.5 x 45 mm screws were placed in 4 and L5 . The lateral gutters were decorticated and graft was packed in the posterolateral gutters between L4-5. Final radiographs were obtained after placing appropriately sized rods between the pedicle screws at L4-5 and torquing these to the appropriate tension. The surgical site was inspected carefully to assure the L4 and L5 nerve roots were well decompressed, hemostasis was obtained, and the graft was well packed. Then the retractors were removed and the wound was closed with #1 Vicryl in the lumbar dorsal fascia 2-0 Vicryl in the subcutaneous tissue and 3-0 Vicryl subcuticularly. Then 20 cc of half percent Marcaine was injected into the paraspinous musculature at the time of closure. Blood loss was estimated at 400 cc. The patient tolerated procedure well and was returned to the recovery room in stable condition.

## 2018-07-29 NOTE — Anesthesia Preprocedure Evaluation (Addendum)
Anesthesia Evaluation  Patient identified by MRN, date of birth, ID band Patient awake    Reviewed: Allergy & Precautions, NPO status , Patient's Chart, lab work & pertinent test results  History of Anesthesia Complications Negative for: history of anesthetic complications  Airway Mallampati: II  TM Distance: >3 FB Neck ROM: Full    Dental no notable dental hx. (+) Dental Advisory Given   Pulmonary former smoker,    breath sounds clear to auscultation       Cardiovascular negative cardio ROS   Rhythm:Regular     Neuro/Psych  Neuromuscular disease negative psych ROS   GI/Hepatic Neg liver ROS, GERD  Medicated and Controlled,  Endo/Other  negative endocrine ROS  Renal/GU negative Renal ROS     Musculoskeletal  (+) Arthritis ,   Abdominal   Peds  Hematology negative hematology ROS (+)   Anesthesia Other Findings   Reproductive/Obstetrics                            Anesthesia Physical Anesthesia Plan  ASA: II  Anesthesia Plan: General   Post-op Pain Management:    Induction: Intravenous  PONV Risk Score and Plan: 2 and Ondansetron and Dexamethasone  Airway Management Planned: Oral ETT  Additional Equipment: None  Intra-op Plan:   Post-operative Plan: Extubation in OR  Informed Consent: I have reviewed the patients History and Physical, chart, labs and discussed the procedure including the risks, benefits and alternatives for the proposed anesthesia with the patient or authorized representative who has indicated his/her understanding and acceptance.   Dental advisory given  Plan Discussed with: CRNA and Surgeon  Anesthesia Plan Comments:         Anesthesia Quick Evaluation

## 2018-07-29 NOTE — Anesthesia Procedure Notes (Signed)
Procedure Name: Intubation Date/Time: 07/29/2018 10:43 AM Performed by: Neldon Newport, CRNA Pre-anesthesia Checklist: Timeout performed, Patient being monitored, Suction available, Emergency Drugs available and Patient identified Patient Re-evaluated:Patient Re-evaluated prior to induction Oxygen Delivery Method: Circle system utilized Preoxygenation: Pre-oxygenation with 100% oxygen Induction Type: IV induction and Rapid sequence Ventilation: Mask ventilation without difficulty and Oral airway inserted - appropriate to patient size Laryngoscope Size: Mac and 3 Grade View: Grade I Tube type: Oral Tube size: 7.5 mm Number of attempts: 1 Placement Confirmation: breath sounds checked- equal and bilateral,  positive ETCO2 and ETT inserted through vocal cords under direct vision Secured at: 22 cm Tube secured with: Tape Dental Injury: Teeth and Oropharynx as per pre-operative assessment

## 2018-07-29 NOTE — Progress Notes (Signed)
Patient ID: Carl Moody, male   DOB: 1941/09/07, 77 y.o.   MRN: [id number] Vital signs are stable. Patient is comfortable  dressing is dry. stable

## 2018-07-29 NOTE — H&P (Signed)
CHIEF COMPLAINT: Buttock and right leg pain and low back pain.  HISTORY OF PRESENT ILLNESS: Carl Moody is a 77 year old right-handed individual who tells me that he has been having some symptoms in his low back and right lower extremity since at least 2016.  He notes that he has had some back issues on and off and he has been seen by Dr. Suszanne Finch for chiropractic adjustments over these last 4 years.  He finds that more recently, the symptoms have been increasing with increasing numbness and pain, and he finds that it is difficult to sit comfortably for any length of time. By the same token, it is difficult to stand comfortably for any length of time without incurring some discomfort in the right buttock and right leg all the way down to the right foot with numbness into those areas.  He had seen Dr. Dagmar Hait and ultimately had some plain x-rays and an MRI of the lumbar spine performed in the past month's time.  Plain x-rays demonstrate that Carl Moody has overall very healthy-looking lumbar spine save for L4-L5 where he has a grade 1 spondylolisthesis.  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  Curiously, he notes that his left side does not bother him at all.  The symptoms have been primarily focused on the right leg.  He has not noticed any weakness in the right lower extremity.  Beyond the chiropractic interventions, he has not had extensive physical therapy or other interventions, but he does note that he keeps himself healthy by regularly working out and maintaining a good level of physical activity.  PAST MEDICAL HISTORY: Reveals that his general health has been excellent.  He has some gastroesophageal reflux symptoms.  MEDICATIONS: His only medication is  Omeprazole 20 mg with sodium bicarb for his GERD.  SURGICAL HISTORY: In the past includes removal of a lipoma from the left arm and removal of a growth in the left shoulder a few years back.  SYSTEMS REVIEW: Notable for some arthritis and back pain as the only complaints on a 14-point review sheet.  PHYSICAL EXAMINATION: He stands straight and erect.  He walks without antalgia.  He has good strength in the iliopsoas, quads, tibialis anterior, and gastrocs as noted by toe and heel walking.  Tone and bulk in the major groups are intact.  Reflexes are 1+ in both patellae and absent in both Achilles and straight leg raising is negative bilaterally to 80 degrees with Patrick maneuver being negative also.  IMPRESSION: The patient has a spondylolisthesis at L4-L5 with an otherwise healthy-looking back.  He has some very minimal facet arthropathy in the level above, but overall for age, his back is in excellent condition, save for the degree of stenosis at that L4-L5 level.  I have advised that we should approach this with some simple conservative care, and I believe that enrolling him in a physical therapy program would be of benefit.  I also believe that he would benefit from a singular translaminar injection at the level of L4-L5.  This can help to alleviate the worst of the inflammation that is likely aggravating that right leg pain and if it is successful, we can simply monitor this situation.  I did note though that the stenosis that he has at L4-5 is  quite high grade and at some point, the symptoms may not be responsible conservative effort.  If that is the case, he ultimately may need to consider surgical intervention to decompress and stabilize the L4-5 joint.  I discussed the surgery with him today noting 2 approaches, 1 being done posteriorly and open, that is to remove the laminar arch, remove the entirety of the disc, placed spacers into the disc space and then screws into the L4 and L5 vertebrae to  hold them together along with bone graft in the disc space to allow this to heal together as 1 bone.  This is a traditional operation.  The alternative would be to do a minimally invasive operation where an incision is made on the side and a small tube is placed over the disc space with a spacer being placed into the interspace to stretch the interspace open from the side and then placing robotically-directed screws from the back through the scan.  This more minimally invasive approach allows for less blood loss and generally a slightly quicker recovery.  It would be what I would consider the better of the two procedures for Carl Moody himself.

## 2018-07-29 NOTE — Social Work (Signed)
CSW acknowledging consult for SNF placement. Will follow for therapy recommendations.   Garrie Elenes H Tyffani Foglesong, LCSWA [facility] Social Work [phone]   

## 2018-07-30 LAB — CBC
HCT: 40.9 % (ref 39.0–52.0)
Hemoglobin: 13.5 g/dL (ref 13.0–17.0)
MCH: 30 pg (ref 26.0–34.0)
MCHC: 33 g/dL (ref 30.0–36.0)
MCV: 90.9 fL (ref 80.0–100.0)
NRBC: 0 % (ref 0.0–0.2)
PLATELETS: 198 10*3/uL (ref 150–400)
RBC: 4.5 MIL/uL (ref 4.22–5.81)
RDW: 13.1 % (ref 11.5–15.5)
WBC: 16 10*3/uL — ABNORMAL HIGH (ref 4.0–10.5)

## 2018-07-30 LAB — BASIC METABOLIC PANEL
ANION GAP: 11 (ref 5–15)
BUN: 18 mg/dL (ref 8–23)
CO2: 22 mmol/L (ref 22–32)
Calcium: 8.3 mg/dL — ABNORMAL LOW (ref 8.9–10.3)
Chloride: 101 mmol/L (ref 98–111)
Creatinine, Ser: 1.22 mg/dL (ref 0.61–1.24)
GFR calc Af Amer: >60 mL/min (ref >60)
GFR calc non Af Amer: 55 mL/min — ABNORMAL LOW (ref >60)
Glucose, Bld: 145 mg/dL — ABNORMAL HIGH (ref 70–99)
POTASSIUM: 4.1 mmol/L (ref 3.5–5.1)
SODIUM: 134 mmol/L — AB (ref 135–145)

## 2018-07-30 MED ORDER — PROMETHAZINE HCL 25 MG/ML IJ SOLN
25.0000 mg | INTRAMUSCULAR | Status: DC | PRN
  Administered 2018-07-30: 25 mg via INTRAMUSCULAR
  Filled 2018-07-30: qty 1

## 2018-07-30 NOTE — Progress Notes (Signed)
Patient ID: Carl Moody, male   DOB: Jun 27, 1941, 77 y.o.   MRN: [id number] All signs are stable Motor function is intact Mobilizing well Continue to work with physical therapy

## 2018-07-30 NOTE — Evaluation (Signed)
Physical Therapy Evaluation Patient Details Name: Carl Moody MRN: [id number] DOB: April 25, 1941 Today's Date: 07/30/2018   History of Present Illness  77 yo male s/p L4-5 decompressive laminectomy decompression of L4 and L5 nerve roots, posterior lumbar interbody arthrodesis with peek spacers local autograft and allograft, pedicle screw fixation L4-5, posterior lateral arthrodesis L4-5.  Pt with significant PMH of R shoulder surgery and DJD.    Clinical Impression  Pt is POD #1 and is mobilizing well with improving LE symptoms compared to pre op.  Back education initiated and stair training started.  He will be able to d/c home and will likely not need any PT follow up at discharge.   PT to follow acutely for deficits listed below.      Follow Up Recommendations No PT follow up    Equipment Recommendations  None recommended by PT    Recommendations for Other Services   NA    Precautions / Restrictions Precautions Precautions: Back Precaution Booklet Issued: Yes (comment) Precaution Comments: handout provided and reviewed for adls Required Braces or Orthoses: Spinal Brace Spinal Brace: Lumbar corset;Applied in sitting position          Balance Overall balance assessment: Needs assistance Sitting-balance support: Feet supported;No upper extremity supported Sitting balance-Leahy Scale: Good     Standing balance support: Bilateral upper extremity supported;No upper extremity supported Standing balance-Leahy Scale: Fair                               Pertinent Vitals/Pain Pain Assessment: Faces Faces Pain Scale: Hurts little more Pain Location: incisional Pain Descriptors / Indicators: Operative site guarding;Grimacing;Guarding Pain Intervention(s): Limited activity within patient's tolerance;Monitored during session;Repositioned    Home Living Family/patient expects to be discharged to:: Private residence Living Arrangements: Spouse/significant  other Available Help at Discharge: Family Type of Home: House Home Access: Stairs to enter Entrance Stairs-Rails: Right Entrance Stairs-Number of Steps: 4 Home Layout: Two level;1/2 bath on main level;Bed/bath upstairs Home Equipment: Walker - 2 wheels;Cane - single point      Prior Function Level of Independence: Independent         Comments: likes to work in [place]: Right    Extremity/Trunk Assessment   Upper Extremity Assessment Upper Extremity Assessment: Defer to OT evaluation    Lower Extremity Assessment Lower Extremity Assessment: Overall WFL for tasks assessed(R LE symptoms improved comared to pre-op)    Cervical / Trunk Assessment Cervical / Trunk Assessment: Other exceptions(s/p surg)  Communication   Communication: HOH(wears hearing aides)  Cognition Arousal/Alertness: Awake/alert Behavior During Therapy: WFL for tasks assessed/performed Overall Cognitive Status: Within Functional Limits for tasks assessed                                               Assessment/Plan    PT Assessment Patient needs continued PT services  PT Problem List Decreased balance;Decreased activity tolerance;Decreased mobility;Decreased knowledge of use of DME;Decreased knowledge of precautions;Pain       PT Treatment Interventions DME instruction;Gait training;Stair training;Functional mobility training;Therapeutic activities;Therapeutic exercise;Balance training;Patient/family education    PT Goals (Current goals can be found in the Care Plan section)  Acute Rehab PT Goals 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 : Unable Difficulty sitting down on and standing up from a chair with arms (e.g., wheelchair, bedside commode, etc,.)?: Unable Help needed moving to and from a bed to chair (including a wheelchair)?: A Little Help needed walking in hospital room?: A Little Help needed climbing 3-5 steps with a railing? : A Little 6 Click Score: 12    End of Session Equipment Utilized During Treatment: Back brace Activity Tolerance: Patient tolerated treatment well Patient left: in chair;with call bell/phone within reach   PT Visit Diagnosis: Muscle weakness (generalized) (M62.81);Difficulty in walking, not elsewhere classified (R26.2);Pain Pain - Right/Left: (lower) Pain - part of body: (back)    Time: 1713-1730 PT Time Calculation (min) (ACUTE ONLY): 17 min   Charges:          Wells Guiles B. Jamise Pentland, PT, DPT  Acute Rehabilitation [phone] pager #[phone] office   PT Evaluation $PT Eval Low Complexity: 1 Low          07/30/2018, 7:31 PM

## 2018-07-30 NOTE — Evaluation (Signed)
Occupational Therapy Evaluation Patient Details Name: Carl Moody MRN: [id number] DOB: 1940-11-17 Today's Date: 07/30/2018    History of Present Illness 77 yo male s/p L4-5 decompressive laminectomy decompression of L4 and L5 nerve roots, posterior lumbar interbody arthrodesis with peek spacers local autograft and allograft, pedicle screw fixation L4-5, posterior lateral arthrodesis L4-5   Clinical Impression   Patient is s/p PLIF L4-5 surgery resulting in functional limitations due to the deficits listed below (see OT problem list). Pt currently min (A) for sit<>stand from bed surface. Pt able to don brace at EOB . Pt with good recall of precautions verbally but needs cues for functional task.  Patient will benefit from skilled OT acutely to increase independence and safety with ADLS to allow discharge home with wife.     Follow Up Recommendations  No OT follow up    Equipment Recommendations  None recommended by OT    Recommendations for Other Services       Precautions / Restrictions Precautions Precautions: Back Precaution Comments: handout provided and reviewed for adls Required Braces or Orthoses: Spinal Brace Spinal Brace: Lumbar corset;Applied in sitting position      Mobility Bed Mobility Overal bed mobility: Needs Assistance Bed Mobility: Rolling;Supine to Sit Rolling: Min guard   Supine to sit: Supervision     General bed mobility comments: pt with hOb 20 degrees to simulate pillow at home. pt able to complete transfer with min v/c for sequence  Transfers Overall transfer level: Needs assistance Equipment used: Rolling walker (2 wheeled) Transfers: Sit to/from Stand Sit to Stand: Min assist              Balance                                           ADL either performed or assessed with clinical judgement   ADL Overall ADL's : Needs assistance/impaired Eating/Feeding: Independent   Grooming: Supervision/safety    Upper Body Bathing: Supervision/ safety   Lower Body Bathing: Min guard;Sit to/from stand   Upper Body Dressing : Supervision/safety   Lower Body Dressing: Min guard;Sit to/from stand Lower Body Dressing Details (indicate cue type and reason): pt is able to figure 4 cross for LB dressing Toilet Transfer: Minimal assistance;RW     Toileting - Clothing Manipulation Details (indicate cue type and reason): educated on toilet hygiene and able to simulate with lateral lean   Tub/Shower Transfer Details (indicate cue type and reason): to be addressed next session Functional mobility during ADLs: Supervision/safety;Rolling walker General ADL Comments: pt provided handout and reviewed with adl and good recall. pt needed incr time to recall lifting restrictions. pt completed bed transfer and sink  level grooming. pt vomiting after movement with RN at bedside at the end of session  Back handout provided and reviewed adls in detail. Pt educated on: clothing between brace, never sleep in brace,  avoid sitting for long periods of time, correct bed positioning for sleeping, correct sequence for bed mobility, avoiding lifting more than 5 pounds .    Vision         Perception     Praxis      Pertinent Vitals/Pain Pain Assessment: 0-10 Pain Score: 5  Pain Descriptors / Indicators: Constant;Operative site guarding     Hand Dominance Right   Extremity/Trunk Assessment Upper Extremity Assessment Upper Extremity Assessment: Overall WFL for tasks assessed  Lower Extremity Assessment Lower Extremity Assessment: Defer to PT evaluation   Cervical / Trunk Assessment Cervical / Trunk Assessment: Other exceptions(s/p surg)   Communication Communication Communication: HOH(wears hearing aides)   Cognition Arousal/Alertness: Awake/alert Behavior During Therapy: WFL for tasks assessed/performed Overall Cognitive Status: Within Functional Limits for tasks assessed                                      General Comments  back brace don / doff with education    Exercises     Shoulder Instructions      Home Living Family/patient expects to be discharged to:: Private residence Living Arrangements: Spouse/significant other Available Help at Discharge: Family Type of Home: House Home Access: Stairs to enter Technical brewer of Steps: 4   Home Layout: Two level;1/2 bath on main level;Bed/bath upstairs     Bathroom Shower/Tub: (has a chair lift for second floor)   Bathroom Toilet: Standard     Home Equipment: Environmental consultant - 2 wheels;Cane - single point          Prior Functioning/Environment Level of Independence: Independent        Comments: likes to work in [employer]         OT Problem List: Decreased activity tolerance;Decreased knowledge of precautions;Decreased knowledge of use of DME or AE;Decreased safety awareness;Impaired balance (sitting and/or standing);Pain      OT Treatment/Interventions: Self-care/ADL training;Therapeutic activities;Therapeutic exercise;DME and/or AE instruction;Manual therapy;Patient/family education;Balance training    OT Goals(Current goals can be found in the care plan section) Acute Rehab OT Goals 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 OT Goal Formulation: With patient Time For Goal Achievement: 08/13/18 Potential to Achieve Goals: Good  OT Frequency: Min 2X/week   Barriers to D/C:            Co-evaluation              AM-PAC PT "6 Clicks" Daily Activity     Outcome Measure Help from another person eating meals?: None Help from another person taking care of personal grooming?: A Little Help from another person toileting, which includes using toliet, bedpan, or urinal?: A Little Help from another person bathing (including washing, rinsing, drying)?: A Little Help from another person to put on and taking off regular upper body clothing?: None Help from another person to put on and taking  off regular lower body clothing?: A Little 6 Click Score: 20   End of Session Equipment Utilized During Treatment: Rolling walker;Back brace Nurse Communication: Mobility status;Precautions  Activity Tolerance: Patient tolerated treatment well Patient left: in chair;with call bell/phone within reach;with chair alarm set  OT Visit Diagnosis: Unsteadiness on feet (R26.81)                Time: 6720-9470 OT Time Calculation (min): 48 min Charges:  OT General Charges $OT Visit: 1 Visit OT Evaluation $OT Eval Moderate Complexity: 1 Mod OT Treatments $Self Care/Home Management : 23-37 mins   Jeri Modena, OTR/L  Acute Rehabilitation Services Pager: [phone] Office: [phone] .   Parke Poisson B 07/30/2018, 8:55 AM

## 2018-07-30 NOTE — Anesthesia Postprocedure Evaluation (Signed)
Anesthesia Post Note  Patient: Carl Moody  Procedure(s) Performed: Lumbar four-five Posterior Lumbar Interbody Fusion (N/A Back)     Patient location during evaluation: PACU Anesthesia Type: General Level of consciousness: awake and alert Pain management: pain level controlled Vital Signs Assessment: post-procedure vital signs reviewed and stable Respiratory status: spontaneous breathing, nonlabored ventilation, respiratory function stable and patient connected to nasal cannula oxygen Cardiovascular status: blood pressure returned to baseline and stable Postop Assessment: no apparent nausea or vomiting Anesthetic complications: no    Last Vitals:  Vitals:   07/30/18 1148 07/30/18 1618  BP: (!) 144/68 (!) 143/65  Pulse: 78 89  Resp: 20 19  Temp: 36.6 C 36.8 C  SpO2: 98% 98%    Last Pain:  Vitals:   07/30/18 1800  TempSrc:   PainSc: 4                  Capri Raben

## 2018-07-30 NOTE — Progress Notes (Signed)
Patient rested well after 1 occurrence of nausea treated with Zofran, Patietn tolerated ;po pain med well for pain with IV Toradol.

## 2018-07-30 NOTE — Progress Notes (Signed)
Orthopedic Tech Progress Note Patient Details:  SABASTION HRDLICKA [birth year] [id number]  Patient ID: [name], male   DOB: [birth year], 77 y.o.   MRN: [id number]   Hildred Priest 07/30/2018, 9:34 AM Called in bio-tech brace order; spoke with Bella Kennedy

## 2018-07-31 ENCOUNTER — Encounter (HOSPITAL_COMMUNITY): Payer: Self-pay

## 2018-07-31 MED ORDER — HYDROCODONE-ACETAMINOPHEN 5-325 MG PO TABS: 1.0000 | ORAL_TABLET | ORAL | 0 refills | Status: DC | PRN

## 2018-07-31 MED ORDER — METHOCARBAMOL 500 MG PO TABS: 500.0000 mg | ORAL_TABLET | Freq: Four times a day (QID) | ORAL | 3 refills | Status: DC | PRN

## 2018-07-31 NOTE — Progress Notes (Signed)
Physical Therapy Treatment Patient Details Name: Carl Moody MRN: [id number] DOB: Feb 05, 1941 Today's Date: 07/31/2018    History of Present Illness 77 yo male s/p L4-5 decompressive laminectomy decompression of L4 and L5 nerve roots, posterior lumbar interbody arthrodesis with peek spacers local autograft and allograft, pedicle screw fixation L4-5, posterior lateral arthrodesis L4-5.  Pt with significant PMH of R shoulder surgery and DJD.      PT Comments    Pt is progressing well with mobility.  He was able to do a full flight of stairs, but felt it was straining and painful, so he has decided to use the chair lift to get to his second level until his pain diminishes and he feels better. We continued education on back precautions, sleeping positions, and frequent walking. Pt verbalized understanding and is hopeful for d/c home later today.   Follow Up Recommendations  No PT follow up     Equipment Recommendations  None recommended by PT    Recommendations for Other Services   NA     Precautions / Restrictions Precautions Precautions: Back Precaution Booklet Issued: Yes (comment) Precaution Comments: reviewed back precautions, lifting restrictions Required Braces or Orthoses: Spinal Brace Spinal Brace: Lumbar corset;Applied in sitting position    Mobility  Bed Mobility Overal bed mobility: Needs Assistance Bed Mobility: Rolling;Sidelying to Sit Rolling: Supervision Sidelying to sit: Supervision       General bed mobility comments: Supervision for safety, cues for log roll technique  Transfers Overall transfer level: Needs assistance Equipment used: Rolling walker (2 wheeled) Transfers: Sit to/from Stand Sit to Stand: Min guard         General transfer comment: Min guard assist for safety due to painful transitions to and from standing.  Verbal cues for safe hand placement and to limit forward flexion, however, when he tried to come straight up and down, it hurt  more than a minimum amount of forward flexion.   Ambulation/Gait Ambulation/Gait assistance: Supervision Gait Distance (Feet): (200) Assistive device: Rolling walker (2 wheeled) Gait Pattern/deviations: Trunk flexed Gait velocity: decreased   General Gait Details: Verbal cues for upright posture, light hands.    Stairs Stairs: Yes Stairs assistance: Supervision Stair Management: One rail Right Number of Stairs: 10 General stair comments: Pt tried alternating and step to pattern, both were uncomfortable and straining on pt reguardless of which foot he led up/down with, so he decided to use chair lift at home at first to get up to his bedroom.        Balance Overall balance assessment: Needs assistance Sitting-balance support: Feet supported Sitting balance-Leahy Scale: Good     Standing balance support: Bilateral upper extremity supported;No upper extremity supported;Single extremity supported Standing balance-Leahy Scale: Fair                              Cognition Arousal/Alertness: Awake/alert Behavior During Therapy: WFL for tasks assessed/performed Overall Cognitive Status: Within Functional Limits for tasks assessed                                               Pertinent Vitals/Pain Pain Assessment: Faces Faces Pain Scale: Hurts even more Pain Location: incisional Pain Descriptors / Indicators: Operative site guarding;Grimacing;Guarding Pain Intervention(s): Limited activity within patient's tolerance;Monitored during session;Repositioned  PT Goals (current goals can now be found in the care plan section) Acute Rehab PT Goals 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 : Unable Difficulty sitting down on and standing up from a chair with arms (e.g., wheelchair, bedside commode, etc,.)?: Unable Help needed moving to and from a bed to chair (including a wheelchair)?: A Little Help needed walking in hospital room?: A Little Help needed climbing 3-5 steps with a railing? : A Little 6 Click Score: 12    End of Session Equipment Utilized During Treatment: Back brace Activity Tolerance: Patient tolerated treatment well Patient left: in chair;with call bell/phone within reach   PT Visit Diagnosis: Muscle weakness (generalized) (M62.81);Difficulty in walking, not elsewhere classified (R26.2);Pain Pain - Right/Left: (low) Pain - part of body: (back)     Time: 6754-4920 PT Time Calculation (min) (ACUTE ONLY): 29 min  Charges:  $Gait Training: 8-22 mins                    Davin Muramoto B. Kc Summerson, PT, DPT  Acute Rehabilitation [phone] pager #[phone] office   07/31/2018, 12:53 PM

## 2018-07-31 NOTE — Care Management Note (Signed)
Case Management Note  Patient Details  Name: Carl Moody MRN: [id number] Date of Birth: Mar 07, 1941  Subjective/Objective:   76 yo male s/p L4-5 decompressive laminectomy decompression of L4 and L5 nerve roots, posterior lumbar interbody arthrodesis with peek spacers local autograft and allograft, pedicle screw fixation L4-5, posterior lateral arthrodesis L4-5.  PTA, pt independent, lives with spouse.  PCP is Dr. Alfonso Patten. Dagmar Hait.                 Action/Plan: PT/OT recommending no OP follow up.  No DME recommended; no dc needs identified.  Expected Discharge Date:  07/31/18               Expected Discharge Plan:  Home/Self Care  In-House Referral:     Discharge planning Services  CM Consult  Post Acute Care Choice:    Choice offered to:     DME Arranged:    DME Agency:     HH Arranged:    HH Agency:     Status of Service:  Completed, signed off  If discussed at [employer] of Stay Meetings, dates discussed:    Additional Comments:  Reinaldo Raddle, RN, BSN  Trauma/Neuro ICU Case Manager [phone]

## 2018-07-31 NOTE — Plan of Care (Signed)
  Problem: Education: Goal: Knowledge of General Education information will improve Description Including pain rating scale, medication(s)/side effects and non-pharmacologic comfort measures Outcome: Progressing   Problem: Health Behavior/Discharge Planning: Goal: Ability to manage health-related needs will improve Outcome: Progressing   Problem: Clinical Measurements: Goal: Ability to maintain clinical measurements within normal limits will improve Outcome: Progressing Goal: Will remain free from infection Outcome: Progressing Goal: Diagnostic test results will improve Outcome: Progressing Goal: Respiratory complications will improve Outcome: Progressing Goal: Cardiovascular complication will be avoided Outcome: Progressing   Problem: Activity: Goal: Risk for activity intolerance will decrease Outcome: Progressing   Problem: Nutrition: Goal: Adequate nutrition will be maintained Outcome: Progressing   Problem: Education: Goal: Ability to verbalize activity precautions or restrictions will improve Outcome: Progressing Goal: Knowledge of the prescribed therapeutic regimen will improve Outcome: Progressing Goal: Understanding of discharge needs will improve Outcome: Progressing   Problem: Activity: Goal: Ability to avoid complications of mobility impairment will improve Outcome: Progressing Goal: Ability to tolerate increased activity will improve Outcome: Progressing Goal: Will remain free from falls Outcome: Progressing   Problem: Bowel/Gastric: Goal: Gastrointestinal status for postoperative course will improve Outcome: Progressing   Problem: Clinical Measurements: Goal: Ability to maintain clinical measurements within normal limits will improve Outcome: Progressing Goal: Postoperative complications will be avoided or minimized Outcome: Progressing Goal: Diagnostic test results will improve Outcome: Progressing   Problem: Pain Management: Goal: Pain level  will decrease Outcome: Progressing   Problem: Skin Integrity: Goal: Will show signs of wound healing Outcome: Progressing   Problem: Health Behavior/Discharge Planning: Goal: Identification of resources available to assist in meeting health care needs will improve Outcome: Progressing   Problem: Bladder/Genitourinary: Goal: Urinary functional status for postoperative course will improve Outcome: Progressing

## 2018-07-31 NOTE — Progress Notes (Signed)
Patient rested well tonight tolerating po pain meds with major complaints of hiccups relieved with sugar under his tongue. IV fluids stopped last pm tolerating po fluids. Patient states he is ready to go home today.

## 2018-07-31 NOTE — Discharge Summary (Signed)
Physician Discharge Summary  Patient ID: Carl Moody MRN: [id number] DOB/AGE: [birth year] 77 y.o.  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 rate 20, height 5\' 5"  (1.651 m), weight 69.4 kg, SpO2 97 %. Incision is clean and dry Station and gait are intact  Disposition: Discharge disposition: 01-Home or Self Care       Discharge Instructions    Call MD for:  redness, tenderness, or signs of infection (pain, swelling, redness, odor or green/yellow discharge around incision site)   Complete by:  As directed    Call MD for:  severe uncontrolled pain   Complete by:  As directed    Call MD for:  temperature >100.4   Complete by:  As directed    Diet - low sodium heart healthy   Complete by:  As directed    Incentive spirometry RT   Complete by:  As directed    Increase activity slowly   Complete by:  As directed      Allergies as of 07/31/2018   No Known Allergies     Medication List    TAKE these medications   acetaminophen 500 MG tablet Commonly known as:  TYLENOL Take 500 mg by mouth every 6 (six) hours as needed for moderate pain.   aspirin EC 81 MG tablet Take 81 mg by mouth at bedtime.    HYDROcodone-acetaminophen 5-325 MG tablet Commonly known as:  NORCO/VICODIN Take 1-2 tablets by mouth every 4 (four) hours as needed for severe pain ((score 7 to 10)).   ibuprofen 200 MG tablet Commonly known as:  ADVIL,MOTRIN Take 400 mg by mouth every 6 (six) hours as needed (pain).   Melatonin 3 MG Tabs Take 3 mg by mouth daily.   methocarbamol 500 MG tablet Commonly known as:  ROBAXIN Take 1 tablet (500 mg total) by mouth every 6 (six) hours as needed for muscle spasms.   omeprazole 10 MG capsule Commonly known as:  PRILOSEC Take 20 mg by mouth daily.   Omeprazole-Sodium Bicarbonate 20-1100 MG Caps capsule Commonly known as:  ZEGERID Take 1 capsule by mouth daily before breakfast.        Signed: Blanchie Dessert Annella Prowell 07/31/2018, 9:26 AM

## 2018-07-31 NOTE — Consult Note (Signed)
[facility] CM Primary Care Navigator  07/31/2018  Carl Moody 1941-07-20 [id number]   Met with patient and wife (Marcia)at the bedsideto identify possible discharge needs.  Patient reports having "severe lower back pain radiating mostly to right leg" that had led to this admission/ surgery. 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   [facility] [address] [facility] any [facility].  Patient reportsthat wife has been managing his medications at homestraight out of the containers.  Patient has been driving prior to admission/ surgery but wife will be providingtransportationto hisdoctors' appointments after discharge.  Patientverbalizedthatwifewill be theprimary caregiver at home.  Anticipated discharge plan is home per patient.  Patientand wife voiced understanding to call primary care provider's office whenhegets home for a post discharge follow-up appointment within1- 2 weeksor sooner if needs arise.Patient letter (with PCP's contact number) was provided asareminder.  Explained topatientand wife regarding[facility] CM services available for health management andresourcesat home butboth denied any needs or concerns for now. Patient states that wife is there to assist in managing hishealth needs if any.  Patient hadpolitely declined [facility] care management services offered which includes EMMIcalls to follow-up with his recovery at home. Patient and wifewere encouragedto seekreferral to [facility] care management from primary care providerifdeemed necessary and appropriate foranyservicesin thefuture.   [facility] care management information provided for future needs that patient may  have.  Primary care provider's office is listed as providing transition of care (TOC) follow-up.   For additional questions please contact:  Edwena Felty A. Joleene Burnham, BSN, RN-BC [facility] PRIMARY CARE Navigator Cell: [phone]

## 2018-09-24 DIAGNOSIS — M4316 Spondylolisthesis, lumbar region: Secondary | ICD-10-CM | POA: Diagnosis not present

## 2018-11-24 DIAGNOSIS — M79645 Pain in left finger(s): Secondary | ICD-10-CM | POA: Diagnosis not present

## 2018-11-24 DIAGNOSIS — S6992XA Unspecified injury of left wrist, hand and finger(s), initial encounter: Secondary | ICD-10-CM | POA: Diagnosis not present

## 2018-11-27 DIAGNOSIS — M19042 Primary osteoarthritis, left hand: Secondary | ICD-10-CM | POA: Diagnosis not present

## 2018-11-27 DIAGNOSIS — M19049 Primary osteoarthritis, unspecified hand: Secondary | ICD-10-CM | POA: Insufficient documentation

## 2018-11-27 DIAGNOSIS — M79642 Pain in left hand: Secondary | ICD-10-CM | POA: Diagnosis not present

## 2018-11-27 DIAGNOSIS — S6992XA Unspecified injury of left wrist, hand and finger(s), initial encounter: Secondary | ICD-10-CM | POA: Insufficient documentation

## 2018-12-02 DIAGNOSIS — M19049 Primary osteoarthritis, unspecified hand: Secondary | ICD-10-CM | POA: Diagnosis not present

## 2018-12-02 DIAGNOSIS — M79642 Pain in left hand: Secondary | ICD-10-CM | POA: Diagnosis not present

## 2018-12-24 DIAGNOSIS — M4316 Spondylolisthesis, lumbar region: Secondary | ICD-10-CM | POA: Diagnosis not present

## 2018-12-24 DIAGNOSIS — R030 Elevated blood-pressure reading, without diagnosis of hypertension: Secondary | ICD-10-CM | POA: Diagnosis not present

## 2018-12-24 DIAGNOSIS — Z6827 Body mass index (BMI) 27.0-27.9, adult: Secondary | ICD-10-CM | POA: Diagnosis not present

## 2018-12-31 DIAGNOSIS — E785 Hyperlipidemia, unspecified: Secondary | ICD-10-CM | POA: Diagnosis not present

## 2018-12-31 DIAGNOSIS — I10 Essential (primary) hypertension: Secondary | ICD-10-CM | POA: Diagnosis not present

## 2018-12-31 DIAGNOSIS — Z6826 Body mass index (BMI) 26.0-26.9, adult: Secondary | ICD-10-CM | POA: Diagnosis not present

## 2018-12-31 DIAGNOSIS — K219 Gastro-esophageal reflux disease without esophagitis: Secondary | ICD-10-CM | POA: Diagnosis not present

## 2019-01-28 DIAGNOSIS — I10 Essential (primary) hypertension: Secondary | ICD-10-CM | POA: Diagnosis not present

## 2019-03-17 DIAGNOSIS — I10 Essential (primary) hypertension: Secondary | ICD-10-CM | POA: Diagnosis not present

## 2019-03-19 DIAGNOSIS — N183 Chronic kidney disease, stage 3 (moderate): Secondary | ICD-10-CM | POA: Diagnosis not present

## 2019-03-19 DIAGNOSIS — I129 Hypertensive chronic kidney disease with stage 1 through stage 4 chronic kidney disease, or unspecified chronic kidney disease: Secondary | ICD-10-CM | POA: Diagnosis not present

## 2019-03-19 DIAGNOSIS — E785 Hyperlipidemia, unspecified: Secondary | ICD-10-CM | POA: Diagnosis not present

## 2019-05-06 DIAGNOSIS — E7849 Other hyperlipidemia: Secondary | ICD-10-CM | POA: Diagnosis not present

## 2019-05-06 DIAGNOSIS — Z125 Encounter for screening for malignant neoplasm of prostate: Secondary | ICD-10-CM | POA: Diagnosis not present

## 2019-05-06 DIAGNOSIS — N183 Chronic kidney disease, stage 3 (moderate): Secondary | ICD-10-CM | POA: Diagnosis not present

## 2019-05-06 DIAGNOSIS — R82998 Other abnormal findings in urine: Secondary | ICD-10-CM | POA: Diagnosis not present

## 2019-05-13 DIAGNOSIS — M1990 Unspecified osteoarthritis, unspecified site: Secondary | ICD-10-CM | POA: Diagnosis not present

## 2019-05-13 DIAGNOSIS — K635 Polyp of colon: Secondary | ICD-10-CM | POA: Diagnosis not present

## 2019-05-13 DIAGNOSIS — K219 Gastro-esophageal reflux disease without esophagitis: Secondary | ICD-10-CM | POA: Diagnosis not present

## 2019-05-13 DIAGNOSIS — Z1331 Encounter for screening for depression: Secondary | ICD-10-CM | POA: Diagnosis not present

## 2019-05-13 DIAGNOSIS — E785 Hyperlipidemia, unspecified: Secondary | ICD-10-CM | POA: Diagnosis not present

## 2019-05-13 DIAGNOSIS — M545 Low back pain: Secondary | ICD-10-CM | POA: Diagnosis not present

## 2019-05-13 DIAGNOSIS — Z0000 Encounter for general adult medical examination without abnormal findings: Secondary | ICD-10-CM | POA: Diagnosis not present

## 2019-05-13 DIAGNOSIS — N183 Chronic kidney disease, stage 3 (moderate): Secondary | ICD-10-CM | POA: Diagnosis not present

## 2019-05-13 DIAGNOSIS — I129 Hypertensive chronic kidney disease with stage 1 through stage 4 chronic kidney disease, or unspecified chronic kidney disease: Secondary | ICD-10-CM | POA: Diagnosis not present

## 2019-05-13 DIAGNOSIS — Z1339 Encounter for screening examination for other mental health and behavioral disorders: Secondary | ICD-10-CM | POA: Diagnosis not present

## 2019-07-23 DIAGNOSIS — L821 Other seborrheic keratosis: Secondary | ICD-10-CM | POA: Diagnosis not present

## 2019-07-23 DIAGNOSIS — D229 Melanocytic nevi, unspecified: Secondary | ICD-10-CM | POA: Diagnosis not present

## 2019-07-23 DIAGNOSIS — L570 Actinic keratosis: Secondary | ICD-10-CM | POA: Diagnosis not present

## 2019-11-16 IMAGING — CT CT L SPINE W/O CM
3 of 6 series · 12 of 35 positions shown, 14 images · non-contrast
Comparison: Lumbar MRI 04/30/2018.

CLINICAL DATA: 77-year-old male with L4-L5 spondylolisthesis.

EXAM:
CT LUMBAR SPINE WITHOUT CONTRAST
TECHNIQUE: Multidetector CT imaging of the lumbar spine was performed without
intravenous contrast administration. Multiplanar CT image
reconstructions were also generated.

[Series 5: l spine soft · axial · 0.34mm/px · z∈[+1005,+1181]mm · 5 of 128 slices shown, 7 images]
[im 20/128  soft-tissue]
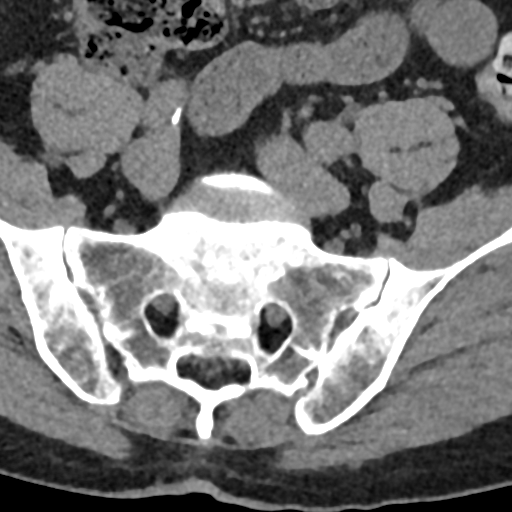
[im 20/128  bone]
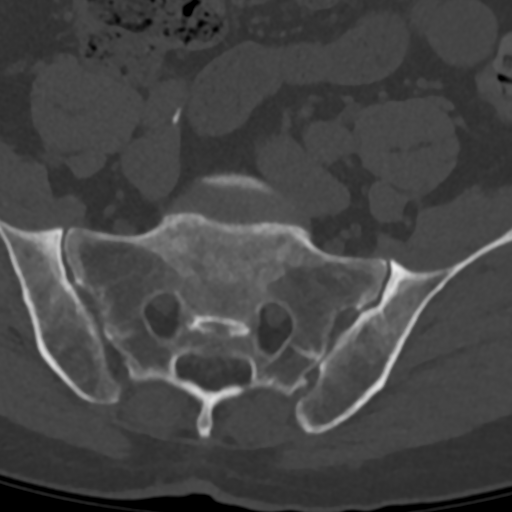
[im 40/128  bone]
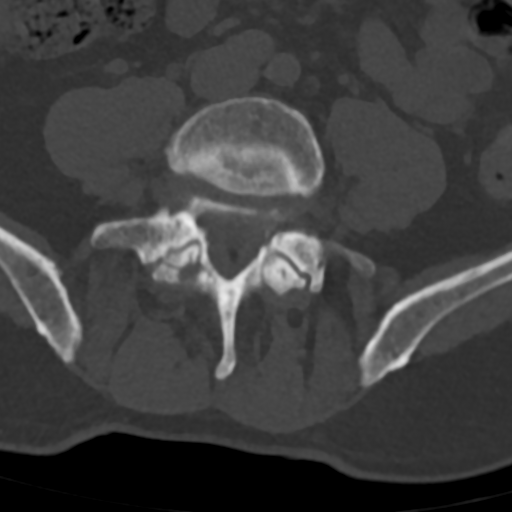
[im 69/128  bone]
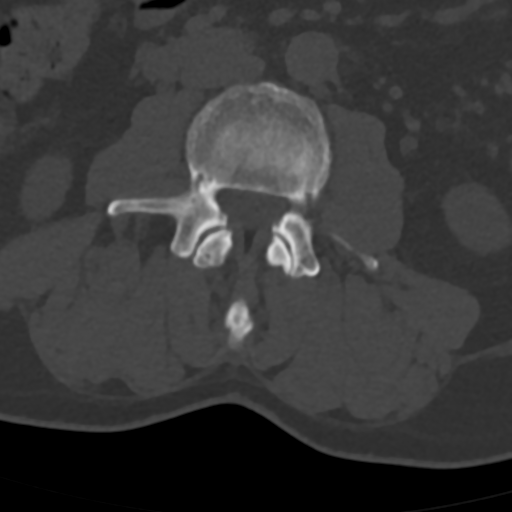
[im 88/128  bone]
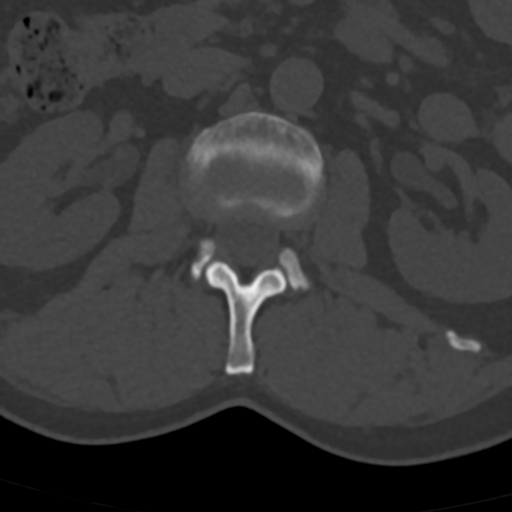
[im 108/128  soft-tissue]
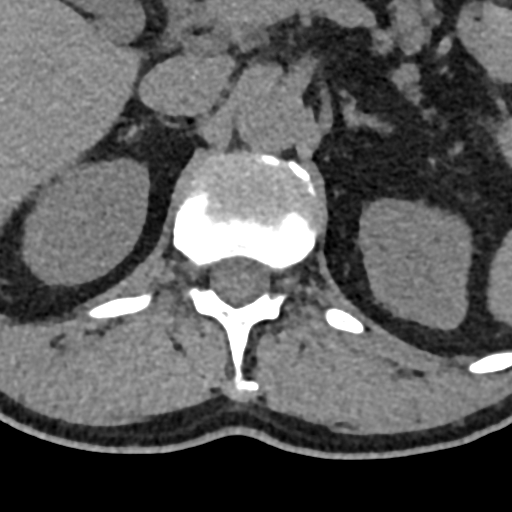
[im 108/128  bone]
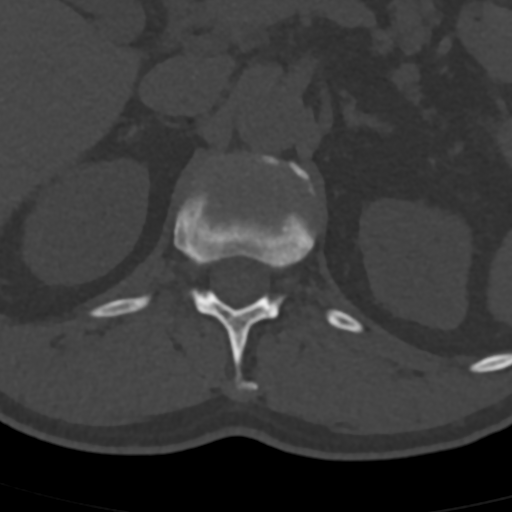

[Series 8: cor st · coronal · 0.36mm/px · 1 of 100 slices shown]
[im 50/100  bone]
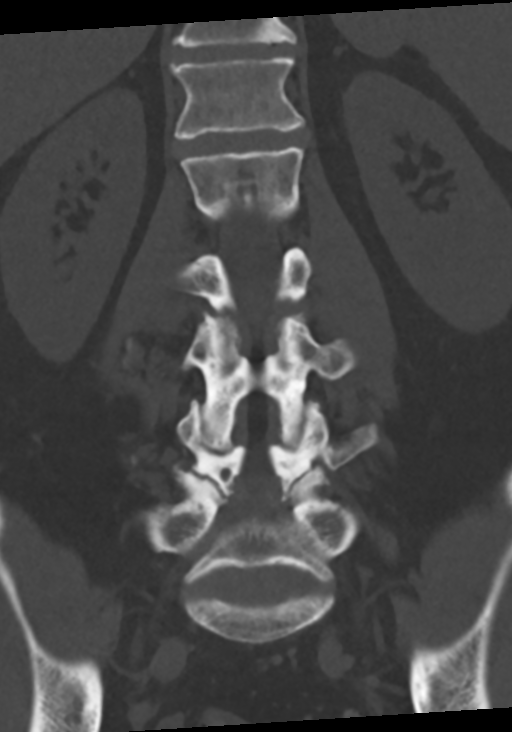

[Series 9: sag st · sagittal · 0.30mm/px · 6 of 89 slices shown]
[im 6/89  soft-tissue]
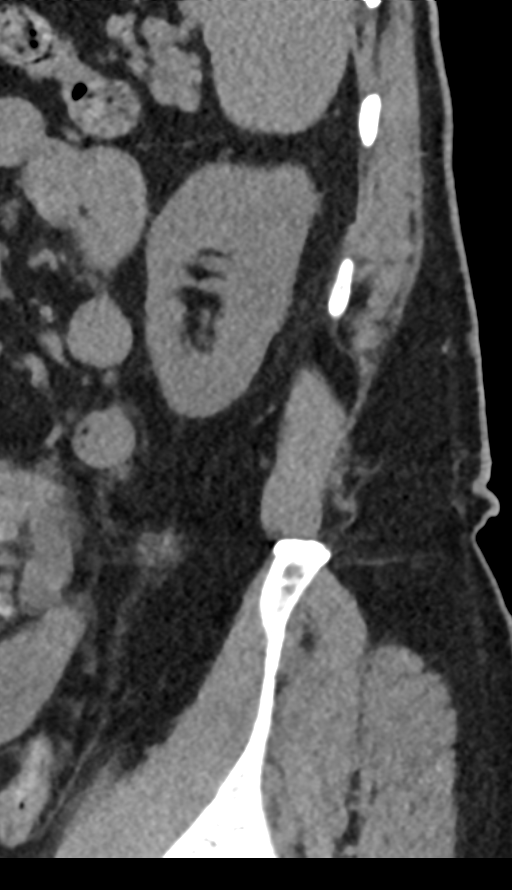
[im 15/89  bone]
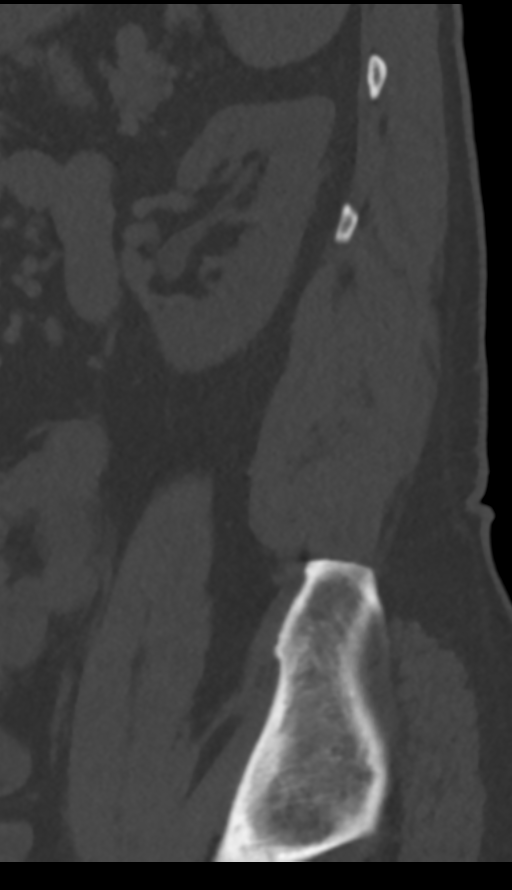
[im 30/89  bone]
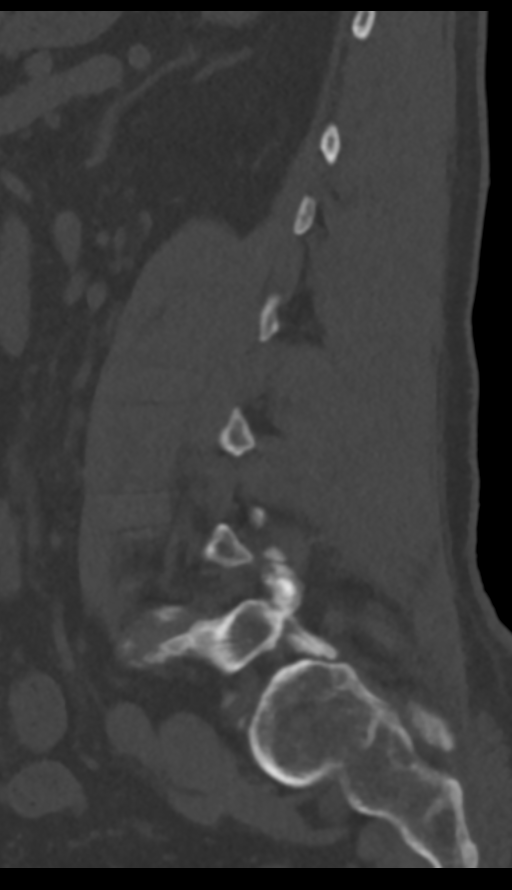
[im 45/89  bone]
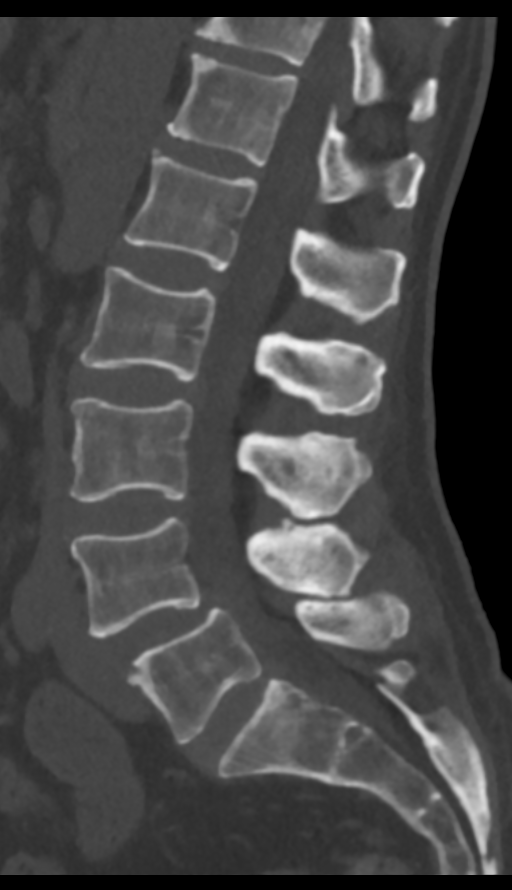
[im 59/89  bone]
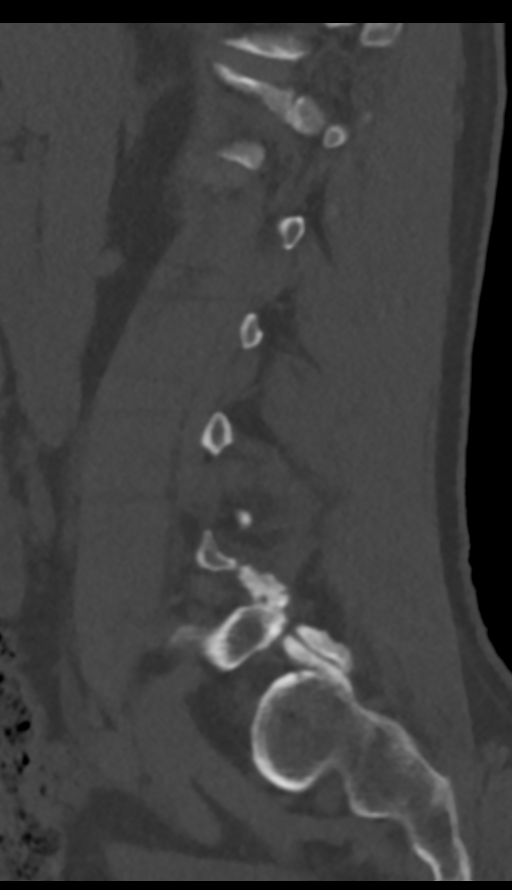
[im 74/89  bone]
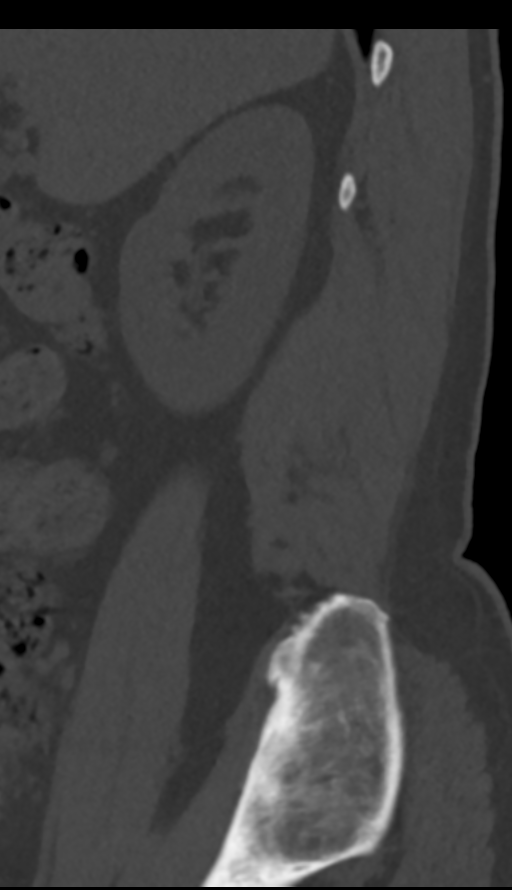

[12 of 35 positions shown; findings below may reference images not displayed]

FINDINGS: Segmentation: Normal as designated on the prior MRI.

Alignment: Grade 1 anterolisthesis of L4 on L5 measures 5-6
millimeters. There is subtle anterolisthesis of L5 on S1. Otherwise
preserved lumbar lordosis. Slight levoconvex lumbar spine curvature.

Vertebrae: No pars fracture. No acute osseous abnormality
identified. Intact visible sacrum and SI joints.

Paraspinal and other soft tissues: Negative visible noncontrast
abdominal viscera. Partially visible sigmoid diverticulosis. Mild
Aortoiliac calcified atherosclerosis. Negative visualized posterior
paraspinal soft tissues.

Disc levels: Fairly capacious lower thoracic and lumbar spinal canal
with no significant spinal stenosis above L4-L5.

L4-L5: Anterolisthesis with severe facet and ligament flavum
hypertrophy. Vacuum facet phenomena on the left. Evidence of
subchondral cysts on the right. Superimposed disc/pseudo disc bulge.
Severe spinal and lateral recess stenosis (series 5, image 88). Mild
right greater than left L4 foraminal stenosis.

L5-S1: Subtle anterolisthesis with moderate facet hypertrophy and
sclerosis greater on the left. No stenosis.
IMPRESSION: 1. Grade 1 anterolisthesis at L4-L5 with severe facet arthropathy
and disc bulging appears stable since the [place] MRI.. Subsequent
severe spinal and lateral recess stenosis, mild right greater than
left L4 foraminal stenosis.
2. Subtle anterolisthesis at L5-S1 with moderate bilateral facet
hypertrophy and sclerosis but no associated stenosis.
3. Mild for age lumbar spine degeneration elsewhere.

## 2019-11-28 IMAGING — RF DG LUMBAR SPINE 2-3V
1 series · 2 of 2 positions shown · non-contrast
Comparison: CT scan of July 17, 2018.

CLINICAL DATA: Status post L4-5 posterior fusion.

EXAM:
DG C-ARM 61-120 MIN; LUMBAR SPINE - 2-3 VIEW
FLUOROSCOPY TIME:  54 seconds.

[Series 1: run · 2 of 2 slices shown]
[im 1/2]
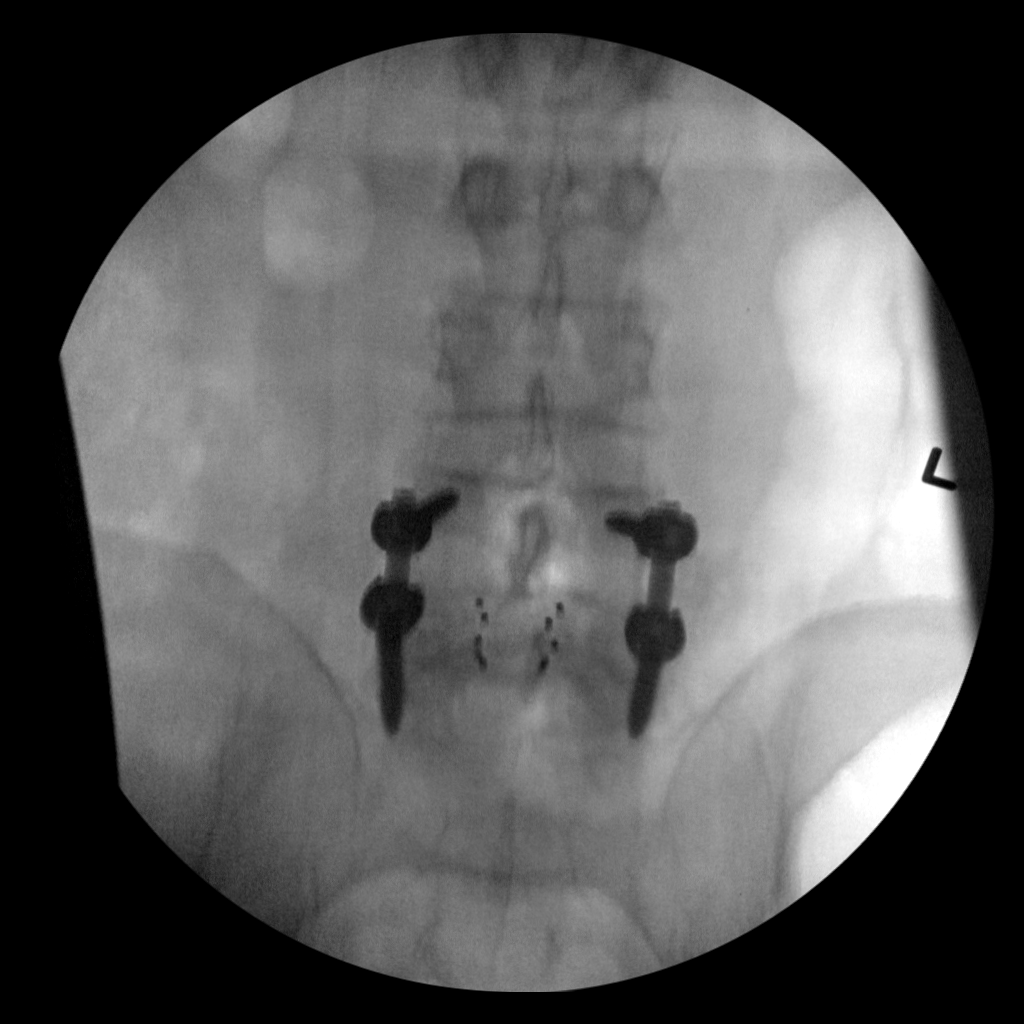
[im 2/2]
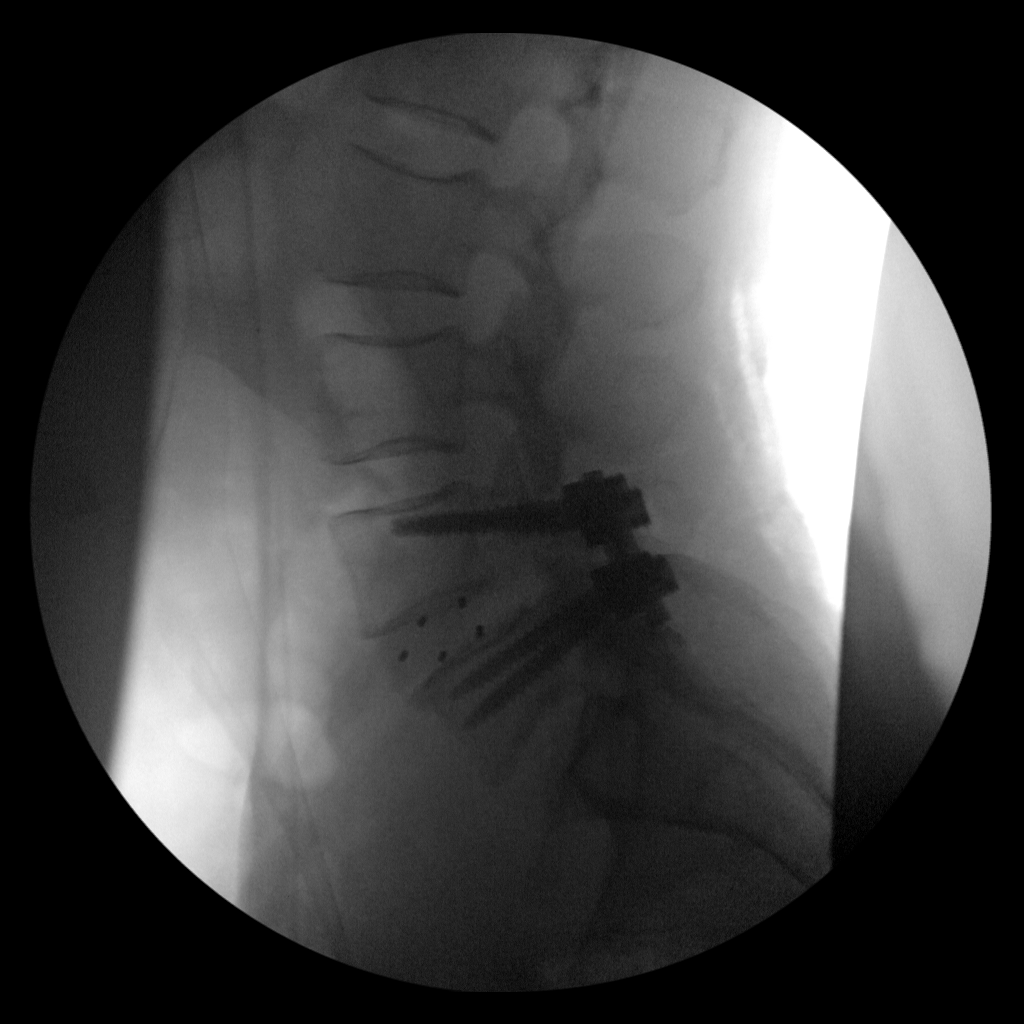

[2 of 2 positions shown; findings below may reference images not displayed]

FINDINGS: Two intraoperative fluoroscopic images were obtained of the lower
lumbar spine. These demonstrate the patient be status post surgical
posterior fusion of L4-5 with bilateral intrapedicular screw
placement and interbody fusion. Good alignment of vertebral bodies
is noted.
IMPRESSION: Status post surgical posterior fusion of L4-5.

## 2019-12-22 ENCOUNTER — Other Ambulatory Visit: Payer: Self-pay | Admitting: Physician Assistant

## 2019-12-22 ENCOUNTER — Other Ambulatory Visit: Payer: Self-pay

## 2019-12-22 ENCOUNTER — Ambulatory Visit (INDEPENDENT_AMBULATORY_CARE_PROVIDER_SITE_OTHER): Payer: PPO | Admitting: Physician Assistant

## 2019-12-22 ENCOUNTER — Encounter: Payer: Self-pay | Admitting: Physician Assistant

## 2019-12-22 DIAGNOSIS — L570 Actinic keratosis: Secondary | ICD-10-CM

## 2019-12-22 DIAGNOSIS — L853 Xerosis cutis: Secondary | ICD-10-CM | POA: Diagnosis not present

## 2019-12-22 DIAGNOSIS — Z1283 Encounter for screening for malignant neoplasm of skin: Secondary | ICD-10-CM

## 2019-12-22 DIAGNOSIS — R21 Rash and other nonspecific skin eruption: Secondary | ICD-10-CM

## 2019-12-22 DIAGNOSIS — Z0000 Encounter for general adult medical examination without abnormal findings: Secondary | ICD-10-CM | POA: Diagnosis not present

## 2019-12-22 MED ORDER — TRIAMCINOLONE ACETONIDE 0.1 % EX CREA: 1.0000 "application " | TOPICAL_CREAM | Freq: Two times a day (BID) | CUTANEOUS | 2 refills | Status: DC | PRN

## 2019-12-22 MED ORDER — TRIAMCINOLONE ACETONIDE 0.1 % EX CREA: TOPICAL_CREAM | Freq: Two times a day (BID) | CUTANEOUS | 3 refills | Status: DC

## 2019-12-22 NOTE — Progress Notes (Addendum)
   Follow-Up Visit   Subjective  Carl Moody is a 79 y.o. male who presents for the following: Pruritis (intense itching at night for an hour to an hour and half. Only on back, ankle, hands. Tx benedryl, and steroid cream. No help started about a month ago.).  The following portions of the chart were reviewed this encounter and updated as appropriate: Tobacco  Allergies  Meds  Problems  Med Hx  Surg Hx  Fam Hx  Soc Hx      Review of Systems: No other skin or systemic complaints.  Objective  Well appearing patient in no apparent distress; mood and affect are within normal limits.  A full examination was performed including scalp, head, eyes, ears, nose, lips, neck, chest, axillae, abdomen, back, buttocks, bilateral upper extremities, bilateral lower extremities, hands, feet, fingers, toes, fingernails, and toenails. All findings within normal limits unless otherwise noted below.  Objective  Left Superior Helix, Left Zygomatic Area, Right Parotid Area (2), Right Zygomatic Area: Erythematous patches with gritty scale.  Objective  Left Ankle - Anterior, Left Ankle - Posterior, Left Shoulder - Anterior, Left Upper Back (2), Right Ankle - Anterior, Right Lower Leg - Posterior, Right Shoulder - Anterior, Right Upper Back (2): Pink xerosis with excoriations  Objective  Head to Toe: No DN or signs of NMSC  Assessment & Plan  AK (actinic keratosis) (5) Left Superior Helix; Left Zygomatic Area; Right Zygomatic Area; Right Parotid Area (2)  Destruction of lesion - Left Superior Helix, Left Zygomatic Area, Right Parotid Area (2), Right Zygomatic Area Complexity: simple   Destruction method: cryotherapy   Informed consent: discussed and consent obtained   Timeout:  patient name, date of birth, surgical site, and procedure verified Lesion destroyed using liquid nitrogen: Yes   Cryotherapy cycles:  1 Outcome: patient tolerated procedure well with no complications   Post-procedure  details: wound care instructions given    Rash and other nonspecific skin eruption (10) Left Shoulder - Anterior; Right Shoulder - Anterior; Left Ankle - Anterior; Right Ankle - Anterior; Left Ankle - Posterior; Right Lower Leg - Posterior; Left Upper Back (2); Right Upper Back (2)  Ordered Rx for TAC 454 BID  Rash Mid Back  Specimen 2 - Culture, Aerobic Bacteria Differential Diagnosis: pyoderma Check Margins: No  Screening exam for skin cancer Head to Toe  observe

## 2019-12-22 NOTE — Patient Instructions (Signed)

## 2019-12-25 ENCOUNTER — Telehealth: Payer: Self-pay

## 2019-12-25 LAB — AEROBIC CULTURE
MICRO NUMBER:: 10256730
SPECIMEN QUALITY:: ADEQUATE

## 2019-12-25 LAB — CLIENT EDUCATION TRACKING

## 2019-12-25 NOTE — Telephone Encounter (Signed)
-----   Message from Warren Danes, [place] sent at 12/25/2019 11:55 AM EDT ----- Stark Bray. No infection.

## 2019-12-25 NOTE — Telephone Encounter (Signed)
Patient returned my phone call for his culture results.  Culture results given to patient.

## 2019-12-25 NOTE — Telephone Encounter (Signed)
Voicemail left for patient to give [place] a call back for his results.

## 2020-01-04 ENCOUNTER — Telehealth: Payer: Self-pay | Admitting: Physician Assistant

## 2020-01-04 NOTE — Telephone Encounter (Signed)
Patient was here approximately 1 week ago, saw [facility], was prescribed medication.  Patient says this medication is not working; irritation is getting worse. Kelli told patient to call back if this happened.  Patient says to leave detailed message if not able to answer. [id number]

## 2020-01-05 ENCOUNTER — Telehealth: Payer: Self-pay | Admitting: Physician Assistant

## 2020-01-05 NOTE — Telephone Encounter (Signed)
Phone call to patient to give him KRS's recommendations and to get him schedule to see [facility].  Patient aware of Kelli's recommendations, he would like to hold off on the cream because he no longer has a rash just more itching.  Patient is wondering if it's more allergies because he is starting to notice hives more at night.  Appointment made with KRS on 01/14/2020 @ 10am.

## 2020-01-05 NOTE — Telephone Encounter (Signed)
Phone call to patient to give him [facility] recommendations.  Patient aware.

## 2020-01-05 NOTE — Telephone Encounter (Signed)
Schedule app for April 30  Rx: clobetasol cream 90 grams qhs 2 RF

## 2020-01-05 NOTE — Telephone Encounter (Signed)
Patient's wife, Tomi Bamberger, called and said that the TAC and Amlactin are no longer working. KRS's next appointment is April 30.  What can patient do?  [facility] [place].

## 2020-01-05 NOTE — Telephone Encounter (Signed)
Have him take antihistamines (claritin bid)

## 2020-01-14 ENCOUNTER — Other Ambulatory Visit: Payer: Self-pay

## 2020-01-14 ENCOUNTER — Encounter: Payer: Self-pay | Admitting: Physician Assistant

## 2020-01-14 ENCOUNTER — Ambulatory Visit: Payer: PPO | Admitting: Physician Assistant

## 2020-01-14 DIAGNOSIS — L299 Pruritus, unspecified: Secondary | ICD-10-CM

## 2020-01-14 MED ORDER — LEVOCETIRIZINE DIHYDROCHLORIDE 5 MG PO TABS: 5.0000 mg | ORAL_TABLET | Freq: Every morning | ORAL | 3 refills | Status: DC

## 2020-01-14 MED ORDER — HYDROXYZINE HCL 10 MG PO TABS: 10.0000 mg | ORAL_TABLET | Freq: Three times a day (TID) | ORAL | 3 refills | Status: DC

## 2020-01-14 NOTE — Progress Notes (Addendum)
   Follow-Up Visit   Subjective  Carl Moody is a 79 y.o. male who presents for the following: Rash (rash still present TAC not woking new flare on leg ). Rash only present on right inner leg. Pt has total body pruritis. He took his wife's antihistamines and was able to sleep at night. Still itching   The following portions of the chart were reviewed this encounter and updated as appropriate: Tobacco  Allergies  Meds  Problems  Med Hx  Surg Hx  Fam Hx      Objective  Well appearing patient in no apparent distress; mood and affect are within normal limits.  A full examination was performed including scalp, head, eyes, ears, nose, lips, neck, chest, axillae, abdomen, back, buttocks, bilateral upper extremities, bilateral lower extremities, hands, feet, fingers, toes, fingernails, and toenails. All findings within normal limits unless otherwise noted below.  Objective  total body: Only skin presentation is on his right inner thigh.small papules in a confluent patch.   Assessment & Plan   Rule out uremic pruritis with secondary excoriations Pruritus total body  CBC with Differential/Platelets - total body  CMP with eGFR([facility]) - total body  levocetirizine (XYZAL) 5 MG tablet - total body  hydrOXYzine (ATARAX/VISTARIL) 10 MG tablet - total body

## 2020-01-18 DIAGNOSIS — L299 Pruritus, unspecified: Secondary | ICD-10-CM | POA: Diagnosis not present

## 2020-01-19 LAB — COMPLETE METABOLIC PANEL WITH GFR
AG Ratio: 1.5 (calc) (ref 1.0–2.5)
ALT: 44 U/L (ref 9–46)
AST: 40 U/L — ABNORMAL HIGH (ref 10–35)
Albumin: 3.9 g/dL (ref 3.6–5.1)
Alkaline phosphatase (APISO): 80 U/L (ref 35–144)
BUN/Creatinine Ratio: 21 (calc) (ref 6–22)
BUN: 30 mg/dL — ABNORMAL HIGH (ref 7–25)
CO2: 29 mmol/L (ref 20–32)
Calcium: 9.2 mg/dL (ref 8.6–10.3)
Chloride: 103 mmol/L (ref 98–110)
Creat: 1.43 mg/dL — ABNORMAL HIGH (ref 0.70–1.18)
GFR, Est African American: 54 mL/min/{1.73_m2} — ABNORMAL LOW (ref >=60)
GFR, Est Non African American: 47 mL/min/{1.73_m2} — ABNORMAL LOW (ref >=60)
Globulin: 2.6 g/dL (calc) (ref 1.9–3.7)
Glucose, Bld: 93 mg/dL (ref 65–99)
Potassium: 4.3 mmol/L (ref 3.5–5.3)
Sodium: 139 mmol/L (ref 135–146)
Total Bilirubin: 0.7 mg/dL (ref 0.2–1.2)
Total Protein: 6.5 g/dL (ref 6.1–8.1)

## 2020-01-19 LAB — CBC WITH DIFFERENTIAL/PLATELET
Absolute Monocytes: 801 cells/uL (ref 200–950)
Basophils Absolute: 31 cells/uL (ref 0–200)
Basophils Relative: 0.4 %
Eosinophils Absolute: 216 cells/uL (ref 15–500)
Eosinophils Relative: 2.8 %
HCT: 45.9 % (ref 38.5–50.0)
Hemoglobin: 15.6 g/dL (ref 13.2–17.1)
Lymphs Abs: 1733 cells/uL (ref 850–3900)
MCH: 30.2 pg (ref 27.0–33.0)
MCHC: 34 g/dL (ref 32.0–36.0)
MCV: 89 fL (ref 80.0–100.0)
MPV: 11 fL (ref 7.5–12.5)
Monocytes Relative: 10.4 %
Neutro Abs: 4920 cells/uL (ref 1500–7800)
Neutrophils Relative %: 63.9 %
Platelets: 224 10*3/uL (ref 140–400)
RBC: 5.16 10*6/uL (ref 4.20–5.80)
RDW: 12.6 % (ref 11.0–15.0)
Total Lymphocyte: 22.5 %
WBC: 7.7 10*3/uL (ref 3.8–10.8)

## 2020-01-19 NOTE — Telephone Encounter (Signed)
-----   Message from Warren Danes, [place] sent at 01/19/2020  8:18 AM EDT ----- Refer to PCP. Check status of pruritis.

## 2020-01-19 NOTE — Telephone Encounter (Signed)
Phone call to patient to check on his status per [facility] request and to inform patient that we will be sending his lab results to his PCP.  Patient states that he's doing much better the new medication seems to be helping him with the itch.  [place] aware.  Patient's labs sent to his PCP.

## 2020-02-05 ENCOUNTER — Other Ambulatory Visit: Payer: Self-pay | Admitting: Physician Assistant

## 2020-02-05 DIAGNOSIS — L299 Pruritus, unspecified: Secondary | ICD-10-CM

## 2020-06-23 DIAGNOSIS — Z125 Encounter for screening for malignant neoplasm of prostate: Secondary | ICD-10-CM | POA: Diagnosis not present

## 2020-06-23 DIAGNOSIS — E785 Hyperlipidemia, unspecified: Secondary | ICD-10-CM | POA: Diagnosis not present

## 2020-06-28 DIAGNOSIS — Z0000 Encounter for general adult medical examination without abnormal findings: Secondary | ICD-10-CM | POA: Diagnosis not present

## 2020-06-28 DIAGNOSIS — M1990 Unspecified osteoarthritis, unspecified site: Secondary | ICD-10-CM | POA: Diagnosis not present

## 2020-06-28 DIAGNOSIS — K219 Gastro-esophageal reflux disease without esophagitis: Secondary | ICD-10-CM | POA: Diagnosis not present

## 2020-06-28 DIAGNOSIS — K635 Polyp of colon: Secondary | ICD-10-CM | POA: Diagnosis not present

## 2020-06-28 DIAGNOSIS — M545 Low back pain: Secondary | ICD-10-CM | POA: Diagnosis not present

## 2020-06-28 DIAGNOSIS — I129 Hypertensive chronic kidney disease with stage 1 through stage 4 chronic kidney disease, or unspecified chronic kidney disease: Secondary | ICD-10-CM | POA: Diagnosis not present

## 2020-06-28 DIAGNOSIS — N1831 Chronic kidney disease, stage 3a: Secondary | ICD-10-CM | POA: Diagnosis not present

## 2020-06-28 DIAGNOSIS — Z23 Encounter for immunization: Secondary | ICD-10-CM | POA: Diagnosis not present

## 2020-06-28 DIAGNOSIS — E785 Hyperlipidemia, unspecified: Secondary | ICD-10-CM | POA: Diagnosis not present

## 2020-07-08 DIAGNOSIS — H524 Presbyopia: Secondary | ICD-10-CM | POA: Diagnosis not present

## 2020-07-08 DIAGNOSIS — H04123 Dry eye syndrome of bilateral lacrimal glands: Secondary | ICD-10-CM | POA: Diagnosis not present

## 2020-09-09 DIAGNOSIS — Z1212 Encounter for screening for malignant neoplasm of rectum: Secondary | ICD-10-CM | POA: Diagnosis not present

## 2020-11-04 ENCOUNTER — Other Ambulatory Visit: Payer: Self-pay | Admitting: Physician Assistant

## 2020-11-04 DIAGNOSIS — L299 Pruritus, unspecified: Secondary | ICD-10-CM

## 2020-12-06 DIAGNOSIS — M19042 Primary osteoarthritis, left hand: Secondary | ICD-10-CM | POA: Diagnosis not present

## 2021-04-13 ENCOUNTER — Other Ambulatory Visit (HOSPITAL_COMMUNITY): Payer: Self-pay | Admitting: Gastroenterology

## 2021-04-13 DIAGNOSIS — K649 Unspecified hemorrhoids: Secondary | ICD-10-CM | POA: Diagnosis not present

## 2021-04-13 DIAGNOSIS — R1310 Dysphagia, unspecified: Secondary | ICD-10-CM | POA: Diagnosis not present

## 2021-04-13 DIAGNOSIS — K219 Gastro-esophageal reflux disease without esophagitis: Secondary | ICD-10-CM | POA: Diagnosis not present

## 2021-04-13 DIAGNOSIS — K449 Diaphragmatic hernia without obstruction or gangrene: Secondary | ICD-10-CM | POA: Diagnosis not present

## 2021-04-25 ENCOUNTER — Ambulatory Visit (HOSPITAL_COMMUNITY)
Admission: RE | Admit: 2021-04-25 | Discharge: 2021-04-25 | Disposition: A | Discharge status: Home / Self Care | Payer: PPO | Source: Ambulatory Visit | Attending: Gastroenterology | Admitting: Gastroenterology

## 2021-04-25 ENCOUNTER — Other Ambulatory Visit: Payer: Self-pay | Admitting: Gastroenterology

## 2021-04-25 ENCOUNTER — Other Ambulatory Visit: Payer: Self-pay

## 2021-04-25 DIAGNOSIS — R1310 Dysphagia, unspecified: Secondary | ICD-10-CM | POA: Diagnosis not present

## 2021-04-25 DIAGNOSIS — K224 Dyskinesia of esophagus: Secondary | ICD-10-CM | POA: Diagnosis not present

## 2021-04-26 ENCOUNTER — Other Ambulatory Visit: Payer: Self-pay

## 2021-05-02 ENCOUNTER — Ambulatory Visit (HOSPITAL_COMMUNITY)
Admission: RE | Admit: 2021-05-02 | Discharge: 2021-05-02 | Disposition: A | Discharge status: Home / Self Care | Payer: PPO | Attending: Gastroenterology | Admitting: Gastroenterology

## 2021-05-02 ENCOUNTER — Ambulatory Visit (HOSPITAL_COMMUNITY): Payer: PPO | Admitting: Anesthesiology

## 2021-05-02 ENCOUNTER — Other Ambulatory Visit: Payer: Self-pay

## 2021-05-02 ENCOUNTER — Encounter (HOSPITAL_COMMUNITY)
Admission: RE | Disposition: A | Discharge status: Home / Self Care | Payer: Self-pay | Source: Home / Self Care | Attending: Gastroenterology

## 2021-05-02 ENCOUNTER — Encounter (HOSPITAL_COMMUNITY): Payer: Self-pay | Admitting: Gastroenterology

## 2021-05-02 DIAGNOSIS — Z87891 Personal history of nicotine dependence: Secondary | ICD-10-CM | POA: Insufficient documentation

## 2021-05-02 DIAGNOSIS — K317 Polyp of stomach and duodenum: Secondary | ICD-10-CM | POA: Insufficient documentation

## 2021-05-02 DIAGNOSIS — Z79899 Other long term (current) drug therapy: Secondary | ICD-10-CM | POA: Diagnosis not present

## 2021-05-02 DIAGNOSIS — R1310 Dysphagia, unspecified: Secondary | ICD-10-CM | POA: Diagnosis not present

## 2021-05-02 DIAGNOSIS — K449 Diaphragmatic hernia without obstruction or gangrene: Secondary | ICD-10-CM | POA: Diagnosis not present

## 2021-05-02 DIAGNOSIS — D131 Benign neoplasm of stomach: Secondary | ICD-10-CM | POA: Diagnosis not present

## 2021-05-02 DIAGNOSIS — R053 Chronic cough: Secondary | ICD-10-CM | POA: Insufficient documentation

## 2021-05-02 DIAGNOSIS — K219 Gastro-esophageal reflux disease without esophagitis: Secondary | ICD-10-CM | POA: Diagnosis not present

## 2021-05-02 DIAGNOSIS — R933 Abnormal findings on diagnostic imaging of other parts of digestive tract: Secondary | ICD-10-CM | POA: Insufficient documentation

## 2021-05-02 DIAGNOSIS — I739 Peripheral vascular disease, unspecified: Secondary | ICD-10-CM | POA: Diagnosis not present

## 2021-05-02 HISTORY — PX: ESOPHAGOGASTRODUODENOSCOPY (EGD) WITH PROPOFOL: SHX5813

## 2021-05-02 HISTORY — PX: UPPER ESOPHAGEAL ENDOSCOPIC ULTRASOUND (EUS): SHX6562

## 2021-05-02 HISTORY — PX: BIOPSY: SHX5522

## 2021-05-02 SURGERY — ESOPHAGOGASTRODUODENOSCOPY (EGD) WITH PROPOFOL
Anesthesia: Monitor Anesthesia Care

## 2021-05-02 MED ORDER — PROPOFOL 500 MG/50ML IV EMUL
INTRAVENOUS | Status: DC | PRN
  Administered 2021-05-02: 150 ug/kg/min via INTRAVENOUS

## 2021-05-02 MED ORDER — PHENYLEPHRINE 40 MCG/ML (10ML) SYRINGE FOR IV PUSH (FOR BLOOD PRESSURE SUPPORT)
PREFILLED_SYRINGE | INTRAVENOUS | Status: DC | PRN
  Administered 2021-05-02: 40 ug via INTRAVENOUS

## 2021-05-02 MED ORDER — LIDOCAINE 2% (20 MG/ML) 5 ML SYRINGE
INTRAMUSCULAR | Status: DC | PRN
  Administered 2021-05-02: 50 mg via INTRAVENOUS

## 2021-05-02 MED ORDER — PROPOFOL 10 MG/ML IV BOLUS
INTRAVENOUS | Status: DC | PRN
  Administered 2021-05-02: 20 mg via INTRAVENOUS
  Administered 2021-05-02 (×2): 30 mg via INTRAVENOUS
  Administered 2021-05-02: 50 mg via INTRAVENOUS

## 2021-05-02 MED ORDER — LACTATED RINGERS IV SOLN: INTRAVENOUS | Status: DC | PRN

## 2021-05-02 MED ORDER — SODIUM CHLORIDE 0.9 % IV SOLN: INTRAVENOUS | Status: DC

## 2021-05-02 SURGICAL SUPPLY — 15 items

## 2021-05-02 NOTE — Anesthesia Preprocedure Evaluation (Addendum)
Anesthesia Evaluation  Patient identified by MRN, date of birth, ID band Patient awake    Reviewed: Allergy & Precautions, NPO status , Patient's Chart, lab work & pertinent test results  History of Anesthesia Complications Negative for: history of anesthetic complications  Airway Mallampati: I  TM Distance: >3 FB Neck ROM: Full    Dental no notable dental hx.    Pulmonary former smoker,    Pulmonary exam normal        Cardiovascular hypertension, Pt. 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  Reproductive/Obstetrics negative OB ROS                            Anesthesia Physical Anesthesia Plan  ASA: 2  Anesthesia Plan: MAC   Post-op Pain Management:    Induction:   PONV Risk Score and Plan: Treatment may vary due to age or medical condition and Propofol infusion  Airway Management Planned: Natural Airway and Nasal Cannula  Additional Equipment:   Intra-op Plan:   Post-operative Plan:   Informed Consent: I have reviewed the patients History and Physical, chart, labs and discussed the procedure including the risks, benefits and alternatives for the proposed anesthesia with the patient or authorized representative who has indicated his/her understanding and acceptance.       Plan Discussed with: CRNA  Anesthesia Plan Comments:        Anesthesia Quick Evaluation

## 2021-05-02 NOTE — Anesthesia Procedure Notes (Signed)
Procedure Name: MAC Date/Time: 05/02/2021 1:01 PM Performed by: Deliah Boston, CRNA Pre-anesthesia Checklist: Patient identified, Emergency Drugs available, Suction available and Patient being monitored Patient Re-evaluated:Patient Re-evaluated prior to induction Oxygen Delivery Method: Simple face mask Preoxygenation: Pre-oxygenation with 100% oxygen Placement Confirmation: positive ETCO2 and breath sounds checked- equal and bilateral

## 2021-05-02 NOTE — Op Note (Signed)
[facility] Patient Name: Carl Moody Procedure Date: 05/02/2021 MRN: [id number] Attending MD: Carol Ada , MD Date of Birth: 08-Nov-1940 CSN: [id number] Age: 80 Admit Type: Outpatient Procedure:                Upper EUS Indications:              Abnormal esophagram/UGI series Providers:                Carol Ada, MD, Kary Kos RN, RN, Terrall Laity, Clae Lenice Pressman, Technician,                            Heide Scales, CRNA Referring MD:              Medicines:                Propofol per Anesthesia Complications:            No immediate complications. Estimated Blood Loss:     Estimated blood loss was minimal. Procedure:                Pre-Anesthesia Assessment:                           - Prior to the procedure, a History and Physical                            was performed, and patient medications and                            allergies were reviewed. The patient's tolerance of                            previous anesthesia was also reviewed. The risks                            and benefits of the procedure and the sedation                            options and risks were discussed with the patient.                            All questions were answered, and informed consent                            was obtained. Prior Anticoagulants: The patient has                            taken no previous anticoagulant or antiplatelet                            agents. ASA Grade Assessment: II - A patient with  mild systemic disease. After reviewing the risks                            and benefits, the patient was deemed in                            satisfactory condition to undergo the procedure.                           - Sedation was administered by an anesthesia                            professional. Deep sedation was attained.                           After obtaining informed consent,  the endoscope was                            passed under direct vision. Throughout the                            procedure, the patient's blood pressure, pulse, and                            oxygen saturations were monitored continuously. The                            [id number]) Olympus Linear EUS was                            introduced through the mouth, and advanced to the                            second part of duodenum. The upper EUS was                            accomplished without difficulty. The patient                            tolerated the procedure well. Scope In: Scope Out: Findings:      ENDOSCOPIC FINDING: :      A 5 cm hiatal hernia was present.      Multiple 5 to 10 mm pedunculated and sessile polyps with no bleeding and       no stigmata of recent bleeding were found in the cardia, in the gastric       fundus and in the gastric body. Biopsies were taken with a cold forceps       for histology.      The examined duodenum was endoscopically normal.      ENDOSONOGRAPHIC FINDING: :      There was no sign of significant endosonographic abnormality in the       common bile duct. The maximum diameter of the duct was 3 mm. An       unremarkable gallbladder was identified.      There was no sign  of significant endosonographic abnormality in the       entire pancreas. The pancreatic duct measured up to 2 mm in diameter. No       masses, the pancreatic duct was well visualized from ampulla to tail,       the pancreatic duct was thin in caliber, the pancreatic duct was regular       in contour.      There was no sign of significant endosonographic abnormality in the       entire examined left adrenal gland. No abnormal echogenicity was       identified.      There was diffuse abnormal echotexture in the left lobe of the liver.       This was characterized by a hyperechoic appearance.      In the large hiatal hernia sac and in the fundus and body of the  stomach       there was evidence of multiple inflammatory-appearing polyps. There was       no evidence of a GE junction mass. Multiple cold biopsies were obtained       from the polyps. Impression:               - 5 cm hiatal hernia.                           - Multiple gastric polyps. Biopsied.                           - Normal examined duodenum.                           - There was no sign of significant pathology in the                            common bile duct.                           - There was no sign of significant pathology in the                            entire pancreas.                           - Endosonographic images of the left adrenal gland                            were unremarkable.                           - There was diffuse abnormal echotexture in the                            left lobe of the liver. This was characterized by a                            hyperechoic appearance. Moderate Sedation:      Not Applicable - Patient had care per Anesthesia. Recommendation:           -  Patient has a contact number available for                            emergencies. The signs and symptoms of potential                            delayed complications were discussed with the                            patient. Return to normal activities tomorrow.                            Written discharge instructions were provided to the                            patient.                           - Resume regular diet.                           - Await path results. Procedure Code(s):        --- Professional ---                           [phone], Esophagogastroduodenoscopy, flexible,                            transoral; with endoscopic ultrasound examination                            limited to the esophagus, stomach or duodenum, and                            adjacent structures                           43239, Esophagogastroduodenoscopy, flexible,                             transoral; with biopsy, single or multiple Diagnosis Code(s):        --- Professional ---                           K31.7, Polyp of stomach and duodenum                           K44.9, Diaphragmatic hernia without obstruction or                            gangrene                           R93.3, Abnormal findings on diagnostic imaging of                            other parts of digestive tract  R93.2, Abnormal findings on diagnostic imaging of                            liver and biliary tract CPT copyright 2019 [employer]. All rights reserved. The codes documented in this report are preliminary and upon coder review may  be revised to meet current compliance requirements. Carol Ada, MD Carol Ada, MD 05/02/2021 1:39:49 PM This report has been signed electronically. Number of Addenda: 0

## 2021-05-02 NOTE — Transfer of Care (Signed)
Immediate Anesthesia Transfer of Care Note  Patient: Carl Moody  Procedure(s) Performed: Procedure(s): ESOPHAGOGASTRODUODENOSCOPY (EGD) WITH PROPOFOL (N/A) UPPER ESOPHAGEAL ENDOSCOPIC ULTRASOUND (EUS) (N/A) BIOPSY  Patient Location: PACU  Anesthesia Type:MAC  Level of Consciousness: Patient easily awoken, sedated, comfortable, cooperative, following commands, responds to stimulation.   Airway & Oxygen Therapy: Patient spontaneously breathing, ventilating well, oxygen via simple oxygen mask.  Post-op Assessment: Report given to PACU RN, vital signs reviewed and stable, moving all extremities.   Post vital signs: Reviewed and stable.  Complications: No apparent anesthesia complications  Last Vitals:  Vitals Value Taken Time  BP 82/50 05/02/21 1336  Temp 36.6 C 05/02/21 1337  Pulse 68 05/02/21 1340  Resp 15 05/02/21 1340  SpO2 100 % 05/02/21 1340  Vitals shown include unvalidated device data.  Last Pain:  Vitals:   05/02/21 1337  TempSrc: Axillary  PainSc:          Complications: No notable events documented.

## 2021-05-02 NOTE — Discharge Instructions (Signed)

## 2021-05-02 NOTE — Anesthesia Postprocedure Evaluation (Signed)
Anesthesia Post Note  Patient: Carl Moody  Procedure(s) Performed: ESOPHAGOGASTRODUODENOSCOPY (EGD) WITH PROPOFOL UPPER ESOPHAGEAL ENDOSCOPIC ULTRASOUND (EUS) BIOPSY     Patient location during evaluation: PACU Anesthesia Type: MAC Level of consciousness: awake and alert and oriented Pain management: pain level controlled Vital Signs Assessment: post-procedure vital signs reviewed and stable Respiratory status: spontaneous breathing, nonlabored ventilation and respiratory function stable Cardiovascular status: blood pressure returned to baseline Postop Assessment: no apparent nausea or vomiting Anesthetic complications: no   No notable events documented.  Last Vitals:  Vitals:   05/02/21 1340 05/02/21 1350  BP: 111/62 (!) 106/42  Pulse: 68 69  Resp: 15 17  Temp:    SpO2: 100% 98%    Last Pain:  Vitals:   05/02/21 1350  TempSrc:   PainSc: 0-No pain                 Brennan Bailey

## 2021-05-02 NOTE — H&P (Signed)
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  An EGD performed by Dr. Collene Moody as an outpatient in the past was negative for any mucosal abnormalities in the GE junction, but he had a large hiatal hernia.  The 13 mm tablet with the esophagram was able to pass through without any difficulty.  Past Medical History:  Diagnosis Date   Actinic keratosis    BPH (benign prostatic hyperplasia)    DJD (degenerative joint disease)    Gastric polyp    GERD (gastroesophageal reflux disease)    Hyperplastic colon polyp     Past Surgical History:  Procedure Laterality Date   CARDIAC CATHETERIZATION N/A 07/13/2015   Procedure: Left Heart Cath and Coronary Angiography;  Surgeon: Carl M Martinique, MD;  Location: [facility];  Service: Cardiovascular;  Laterality: N/A;   cataracts     LIPOMA EXCISION Left 07/26/2016   Procedure: EXCISION LEFT SHOULDER LIPOMA;  Surgeon: Carl Bookbinder, MD;  Location: [place];  Service: General;  Laterality: Left;   SHOULDER SURGERY Right    TONSILLECTOMY     at age 80    History reviewed. No pertinent family history.  Social History:  reports that he has quit smoking. He has never used smokeless tobacco. He reports current alcohol use. He reports that he does not use drugs.  Allergies: No Known Allergies  Medications: Scheduled: Continuous:  sodium chloride      No results found for this or any previous visit (from the past 24 hour(s)).   No results found.  ROS:  As stated above in the HPI otherwise negative.  Blood pressure 134/62, pulse 66, temperature 98.3 F (36.8 C), temperature source Oral, resp. rate 10, height '5\' 5"'$  (1.651 m), weight 74.8 kg, SpO2 100 %.    PE: Gen: NAD, Alert and Oriented HEENT:  [place]/AT, EOMI Neck: Supple, no LAD Lungs: CTA Bilaterally CV: RRR without M/G/R ABD: Soft, NTND, +BS Ext: No C/C/E  Assessment/Plan: 1) ? GE junction mass  - EGD/EUS.  Carl Moody D 05/02/2021, 12:51 PM

## 2021-05-03 LAB — SURGICAL PATHOLOGY

## 2021-05-17 ENCOUNTER — Other Ambulatory Visit: Payer: Self-pay | Admitting: Gastroenterology

## 2021-06-02 ENCOUNTER — Other Ambulatory Visit: Payer: Self-pay

## 2021-06-02 ENCOUNTER — Encounter (HOSPITAL_COMMUNITY): Payer: Self-pay | Admitting: Gastroenterology

## 2021-06-09 ENCOUNTER — Ambulatory Visit (HOSPITAL_COMMUNITY): Payer: PPO | Admitting: Certified Registered Nurse Anesthetist

## 2021-06-09 ENCOUNTER — Encounter (HOSPITAL_COMMUNITY): Payer: Self-pay | Admitting: Gastroenterology

## 2021-06-09 ENCOUNTER — Ambulatory Visit (HOSPITAL_COMMUNITY)
Admission: RE | Admit: 2021-06-09 | Discharge: 2021-06-09 | Disposition: A | Discharge status: Home / Self Care | Payer: PPO | Attending: Gastroenterology | Admitting: Gastroenterology

## 2021-06-09 ENCOUNTER — Other Ambulatory Visit: Payer: Self-pay

## 2021-06-09 ENCOUNTER — Encounter (HOSPITAL_COMMUNITY)
Admission: RE | Disposition: A | Discharge status: Home / Self Care | Payer: Self-pay | Source: Home / Self Care | Attending: Gastroenterology

## 2021-06-09 DIAGNOSIS — Z87891 Personal history of nicotine dependence: Secondary | ICD-10-CM | POA: Diagnosis not present

## 2021-06-09 DIAGNOSIS — I10 Essential (primary) hypertension: Secondary | ICD-10-CM | POA: Diagnosis not present

## 2021-06-09 DIAGNOSIS — I493 Ventricular premature depolarization: Secondary | ICD-10-CM | POA: Diagnosis not present

## 2021-06-09 DIAGNOSIS — D131 Benign neoplasm of stomach: Secondary | ICD-10-CM | POA: Diagnosis not present

## 2021-06-09 DIAGNOSIS — K317 Polyp of stomach and duodenum: Secondary | ICD-10-CM | POA: Insufficient documentation

## 2021-06-09 DIAGNOSIS — K449 Diaphragmatic hernia without obstruction or gangrene: Secondary | ICD-10-CM | POA: Diagnosis not present

## 2021-06-09 DIAGNOSIS — K219 Gastro-esophageal reflux disease without esophagitis: Secondary | ICD-10-CM | POA: Diagnosis not present

## 2021-06-09 HISTORY — PX: POLYPECTOMY: SHX5525

## 2021-06-09 HISTORY — DX: Essential (primary) hypertension: I10

## 2021-06-09 HISTORY — PX: HEMOSTASIS CLIP PLACEMENT: SHX6857

## 2021-06-09 HISTORY — PX: ESOPHAGOGASTRODUODENOSCOPY (EGD) WITH PROPOFOL: SHX5813

## 2021-06-09 SURGERY — ESOPHAGOGASTRODUODENOSCOPY (EGD) WITH PROPOFOL
Anesthesia: Monitor Anesthesia Care

## 2021-06-09 MED ORDER — PHENYLEPHRINE 40 MCG/ML (10ML) SYRINGE FOR IV PUSH (FOR BLOOD PRESSURE SUPPORT)
PREFILLED_SYRINGE | INTRAVENOUS | Status: DC | PRN
  Administered 2021-06-09 (×2): 80 ug via INTRAVENOUS

## 2021-06-09 MED ORDER — SODIUM CHLORIDE 0.9 % IV SOLN: INTRAVENOUS | Status: DC

## 2021-06-09 MED ORDER — PROPOFOL 500 MG/50ML IV EMUL
INTRAVENOUS | Status: DC | PRN
  Administered 2021-06-09: 150 ug/kg/min via INTRAVENOUS

## 2021-06-09 MED ORDER — PROPOFOL 10 MG/ML IV BOLUS
INTRAVENOUS | Status: DC | PRN
  Administered 2021-06-09: 40 mg via INTRAVENOUS
  Administered 2021-06-09: 20 mg via INTRAVENOUS

## 2021-06-09 MED ORDER — LACTATED RINGERS IV SOLN
INTRAVENOUS | Status: DC
  Administered 2021-06-09: 1000 mL via INTRAVENOUS

## 2021-06-09 MED ORDER — LIDOCAINE 2% (20 MG/ML) 5 ML SYRINGE
INTRAMUSCULAR | Status: DC | PRN
  Administered 2021-06-09: 100 mg via INTRAVENOUS

## 2021-06-09 SURGICAL SUPPLY — 15 items

## 2021-06-09 NOTE — Anesthesia Preprocedure Evaluation (Signed)
Anesthesia Evaluation  Patient identified by MRN, date of birth, ID band Patient awake    Reviewed: Allergy & Precautions, NPO status , Patient's Chart, lab work & pertinent test results  History of Anesthesia Complications Negative for: history of anesthetic complications  Airway Mallampati: I  TM Distance: >3 FB Neck ROM: Full    Dental  (+) Teeth Intact   Pulmonary neg pulmonary ROS, former smoker,    Pulmonary exam normal        Cardiovascular hypertension, Pt. on medications Normal cardiovascular exam   Normal cath 2016   Neuro/Psych negative neurological ROS     GI/Hepatic Neg liver ROS, GERD  ,Gastric polyp   Endo/Other  negative endocrine ROS  Renal/GU negative Renal ROS  negative genitourinary   Musculoskeletal negative musculoskeletal ROS (+)   Abdominal   Peds  Hematology negative hematology ROS (+)   Anesthesia Other Findings   Reproductive/Obstetrics                             Anesthesia Physical Anesthesia Plan  ASA: 2  Anesthesia Plan: MAC   Post-op Pain Management:    Induction: Intravenous  PONV Risk Score and Plan: Propofol infusion, TIVA and Treatment may vary due to age or medical condition  Airway Management Planned: Natural Airway, Nasal Cannula and Simple Face Mask  Additional Equipment: None  Intra-op Plan:   Post-operative Plan:   Informed Consent: I have reviewed the patients History and Physical, chart, labs and discussed the procedure including the risks, benefits and alternatives for the proposed anesthesia with the patient or authorized representative who has indicated his/her understanding and acceptance.       Plan Discussed with:   Anesthesia Plan Comments:         Anesthesia Quick Evaluation

## 2021-06-09 NOTE — H&P (Signed)
Carl Moody HPI: The patient is here for polypectomy of the large ulceration hyperplastic gastric polyps.  Past Medical History:  Diagnosis Date   Actinic keratosis    BPH (benign prostatic hyperplasia)    DJD (degenerative joint disease)    Gastric polyp    GERD (gastroesophageal reflux disease)    Hyperplastic colon polyp    Hypertension     Past Surgical History:  Procedure Laterality Date   BIOPSY  05/02/2021   Procedure: BIOPSY;  Surgeon: Carol Ada, MD;  Location: [facility];  Service: Endoscopy;;   CARDIAC CATHETERIZATION N/A 07/13/2015   Procedure: Left Heart Cath and Coronary Angiography;  Surgeon: Peter M Martinique, MD;  Location: [place];  Service: Cardiovascular;  Laterality: N/A;   cataracts     ESOPHAGOGASTRODUODENOSCOPY (EGD) WITH PROPOFOL N/A 05/02/2021   Procedure: ESOPHAGOGASTRODUODENOSCOPY (EGD) WITH PROPOFOL;  Surgeon: Carol Ada, MD;  Location: [facility];  Service: Endoscopy;  Laterality: N/A;   LIPOMA EXCISION Left 07/26/2016   Procedure: EXCISION LEFT SHOULDER LIPOMA;  Surgeon: Rolm Bookbinder, MD;  Location: [place];  Service: General;  Laterality: Left;   SHOULDER SURGERY Right    TONSILLECTOMY     at age 87   UPPER ESOPHAGEAL ENDOSCOPIC ULTRASOUND (EUS) N/A 05/02/2021   Procedure: UPPER ESOPHAGEAL ENDOSCOPIC ULTRASOUND (EUS);  Surgeon: Carol Ada, MD;  Location: [facility];  Service: Endoscopy;  Laterality: N/A;    No family history on file.  Social History:  reports that he has quit smoking. He has never used smokeless tobacco. He reports current alcohol use. He reports that he does not use drugs.  Allergies: No Known Allergies  Medications: Scheduled: Continuous:  sodium chloride     lactated ringers 125 mL/hr at 06/09/21 1007    No results found for this or any previous visit (from the past 24 hour(s)).   No results found.  ROS:  As stated above in the HPI otherwise negative.  Height 5' 5.5"  (1.664 m), weight 74.8 kg.    PE: Gen: NAD, Alert and Oriented HEENT:  [place]/AT, EOMI Neck: Supple, no LAD Lungs: CTA Bilaterally CV: RRR without M/G/R ABD: Soft, NTND, +BS Ext: No C/C/E  Assessment/Plan: 1) Gastric polyps - EGD with polypectomy.  Iasiah Ozment D 06/09/2021, 6:51 AM

## 2021-06-09 NOTE — Transfer of Care (Signed)
Immediate Anesthesia Transfer of Care Note  Patient: Carl Moody  Procedure(s) Performed: ESOPHAGOGASTRODUODENOSCOPY (EGD) WITH PROPOFOL POLYPECTOMY HEMOSTASIS CLIP PLACEMENT  Patient Location: Endoscopy Unit  Anesthesia Type:MAC  Level of Consciousness: awake, alert  and oriented  Airway & Oxygen Therapy: Patient Spontanous Breathing and Patient connected to face mask oxygen  Post-op Assessment: Report given to RN and Post -op Vital signs reviewed and stable  Post vital signs: Reviewed and stable  Last Vitals:  Vitals Value Taken Time  BP 84/49 06/09/21 1112  Temp    Pulse 58 06/09/21 1114  Resp 11 06/09/21 1114  SpO2 100 % 06/09/21 1114  Vitals shown include unvalidated device data.  Last Pain:  Vitals:   06/09/21 0953  TempSrc: Oral  PainSc: 0-No pain         Complications: No notable events documented.

## 2021-06-09 NOTE — Anesthesia Postprocedure Evaluation (Signed)
Anesthesia Post Note  Patient: Carl Moody  Procedure(s) Performed: ESOPHAGOGASTRODUODENOSCOPY (EGD) WITH PROPOFOL POLYPECTOMY HEMOSTASIS CLIP PLACEMENT     Patient location during evaluation: Endoscopy Anesthesia Type: MAC Level of consciousness: awake and alert Pain management: pain level controlled Vital Signs Assessment: post-procedure vital signs reviewed and stable Respiratory status: spontaneous breathing, nonlabored ventilation and respiratory function stable Cardiovascular status: blood pressure returned to baseline and stable Postop Assessment: no apparent nausea or vomiting Anesthetic complications: no   No notable events documented.  Last Vitals:  Vitals:   06/09/21 1120 06/09/21 1131  BP: (!) 105/56 (!) 111/53  Pulse: (!) 56 (!) 59  Resp: 14 14  Temp:    SpO2: 100% 100%    Last Pain:  Vitals:   06/09/21 1131  TempSrc:   PainSc: 0-No pain                 Lidia Collum

## 2021-06-09 NOTE — Op Note (Signed)
[facility] Patient Name: Carl Moody Procedure Date: 06/09/2021 MRN: [id number] Attending MD: Carol Ada , MD Date of Birth: 21-Apr-1941 CSN: [id number] Age: 80 Admit Type: Outpatient Procedure:                Upper GI endoscopy Indications:              Therapeutic procedure Providers:                Carol Ada, MD, Kary Kos RN, RN, Laverda Sorenson, Technician Referring MD:              Medicines:                Propofol per Anesthesia Complications:            No immediate complications. Estimated Blood Loss:     Estimated blood loss was minimal. Procedure:                Pre-Anesthesia Assessment:                           - Prior to the procedure, a History and Physical                            was performed, and patient medications and                            allergies were reviewed. The patient's tolerance of                            previous anesthesia was also reviewed. The risks                            and benefits of the procedure and the sedation                            options and risks were discussed with the patient.                            All questions were answered, and informed consent                            was obtained. Prior Anticoagulants: The patient has                            taken no previous anticoagulant or antiplatelet                            agents. ASA Grade Assessment: III - A patient with                            severe systemic disease. After reviewing the risks  and benefits, the patient was deemed in                            satisfactory condition to undergo the procedure.                           - Sedation was administered by an anesthesia                            professional. Deep sedation was attained.                           After obtaining informed consent, the endoscope was                            passed under direct vision.  Throughout the                            procedure, the patient's blood pressure, pulse, and                            oxygen saturations were monitored continuously. The                            [id number] Olympus endoscope was introduced                            through the mouth, and advanced to the fourth part                            of duodenum. The upper GI endoscopy was technically                            difficult and complex. The patient tolerated the                            procedure well. Scope In: Scope Out: Findings:      A 5 cm hiatal hernia was present.      Multiple 5 to 20 mm pedunculated and sessile polyps with no bleeding and       no stigmata of recent bleeding were found in the gastric fundus and in       the gastric body. The polyp was removed with a hot snare. Resection and       retrieval were complete. To stop active bleeding, eight hemostatic clips       were successfully placed (MR conditional). Bleeding had stopped at the       end of the procedure.      The examined duodenum was normal.      A total of 5 large polyps were removed. The ulcerated hyperplastic       polyps were very friable and bleeding occurred. The polyps were removed       with coagulation hot snare. Because of the larger polypectomies       hemoclips were placed on the resection sites. Several other larger  polyps were left to be removed in the future because of the technical       difficulty. Impression:               - 5 cm hiatal hernia.                           - Multiple gastric polyps. Resected and retrieved.                            Clips (MR conditional) were placed.                           - Normal examined duodenum. Moderate Sedation:      Not Applicable - Patient had care per Anesthesia. Recommendation:           - Patient has a contact number available for                            emergencies. The signs and symptoms of potential                             delayed complications were discussed with the                            patient. Return to normal activities tomorrow.                            Written discharge instructions were provided to the                            patient.                           - Resume regular diet.                           - Continue present medications.                           - Await pathology results.                           - Repeat upper endoscopy in 2-4 weeks for                            retreatment. Procedure Code(s):        --- Professional ---                           [id number], Esophagogastroduodenoscopy, flexible,                            transoral; with removal of tumor(s), polyp(s), or                            other lesion(s) by snare technique Diagnosis Code(s):        ---  Professional ---                           K31.7, Polyp of stomach and duodenum                           K44.9, Diaphragmatic hernia without obstruction or                            gangrene CPT copyright 2019 [employer]. All rights reserved. The codes documented in this report are preliminary and upon coder review may  be revised to meet current compliance requirements. Carol Ada, MD Carol Ada, MD 06/09/2021 11:22:48 AM This report has been signed electronically. Number of Addenda: 0

## 2021-06-09 NOTE — Discharge Instructions (Signed)

## 2021-06-13 ENCOUNTER — Encounter (HOSPITAL_COMMUNITY): Payer: Self-pay | Admitting: Gastroenterology

## 2021-06-15 ENCOUNTER — Ambulatory Visit: Payer: PPO | Admitting: Physician Assistant

## 2021-06-15 ENCOUNTER — Other Ambulatory Visit: Payer: Self-pay

## 2021-06-15 ENCOUNTER — Other Ambulatory Visit: Payer: Self-pay | Admitting: Gastroenterology

## 2021-06-15 DIAGNOSIS — L578 Other skin changes due to chronic exposure to nonionizing radiation: Secondary | ICD-10-CM

## 2021-06-15 DIAGNOSIS — L814 Other melanin hyperpigmentation: Secondary | ICD-10-CM | POA: Diagnosis not present

## 2021-06-15 DIAGNOSIS — Z1283 Encounter for screening for malignant neoplasm of skin: Secondary | ICD-10-CM | POA: Diagnosis not present

## 2021-06-15 DIAGNOSIS — D1800 Hemangioma unspecified site: Secondary | ICD-10-CM | POA: Diagnosis not present

## 2021-06-15 DIAGNOSIS — L570 Actinic keratosis: Secondary | ICD-10-CM

## 2021-06-15 DIAGNOSIS — L821 Other seborrheic keratosis: Secondary | ICD-10-CM | POA: Diagnosis not present

## 2021-06-15 LAB — SURGICAL PATHOLOGY

## 2021-06-20 ENCOUNTER — Encounter: Payer: Self-pay | Admitting: Physician Assistant

## 2021-06-20 NOTE — Progress Notes (Signed)
   Follow-Up Visit   Subjective  Carl Moody is a 80 y.o. male who presents for the following: Annual Exam (Here for annual skin exam. Concerns scalp, shoulders and back. No history of skin cancer. ).   The following portions of the chart were reviewed this encounter and updated as appropriate:  Tobacco  Allergies  Meds  Problems  Med Hx  Surg Hx  Fam Hx      Objective  Well appearing patient in no apparent distress; mood and affect are within normal limits.  A full examination was performed including scalp, head, eyes, ears, nose, lips, neck, chest, axillae, abdomen, back, buttocks, bilateral upper extremities, bilateral lower extremities, hands, feet, fingers, toes, fingernails, and toenails. All findings within normal limits unless otherwise noted below.  Dorsum of Nose, Mid Forehead (7), Mid Frontal Scalp (2), Right Ear Erythematous patches with gritty scale.   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; Mid Frontal Scalp (2); Mid Forehead (7); Dorsum of Nose  Destruction of lesion - Dorsum of Nose, Mid Forehead, Mid Frontal Scalp, Right Ear Complexity: simple   Destruction method: cryotherapy   Informed consent: discussed and consent obtained   Timeout:  patient name, date of birth, surgical site, and procedure verified Lesion destroyed using liquid nitrogen: Yes   Cryotherapy cycles:  1 Outcome: patient tolerated procedure well with no complications   Post-procedure details: wound care instructions given     Lentigines - Scattered tan macules - Discussed due to sun exposure - Benign, observe - Call for any changes  Seborrheic Keratoses - Stuck-on, waxy, tan-brown papules and plaques  - Discussed benign etiology and prognosis. - Observe - Call for any changes  Hemangiomas - Red papules - Discussed benign nature - Observe - Call for any changes  Actinic Damage - diffuse scaly erythematous macules with underlying dyspigmentation -  Recommend daily broad spectrum sunscreen SPF 30+ to sun-exposed areas, reapply every 2 hours as needed.  - Call for new or changing lesions.  Skin cancer screening performed today.  I, Raidyn Wassink, PA-C, have reviewed all documentation's for this visit.  The documentation on 06/20/21 for the exam, diagnosis, procedures and orders are all accurate and complete.

## 2021-07-05 ENCOUNTER — Encounter (HOSPITAL_COMMUNITY): Payer: Self-pay | Admitting: Gastroenterology

## 2021-07-12 DIAGNOSIS — E785 Hyperlipidemia, unspecified: Secondary | ICD-10-CM | POA: Diagnosis not present

## 2021-07-12 DIAGNOSIS — Z125 Encounter for screening for malignant neoplasm of prostate: Secondary | ICD-10-CM | POA: Diagnosis not present

## 2021-07-14 ENCOUNTER — Ambulatory Visit (HOSPITAL_COMMUNITY)
Admission: RE | Admit: 2021-07-14 | Discharge: 2021-07-14 | Disposition: A | Discharge status: Home / Self Care | Payer: PPO | Source: Ambulatory Visit | Attending: Gastroenterology | Admitting: Gastroenterology

## 2021-07-14 ENCOUNTER — Ambulatory Visit (HOSPITAL_COMMUNITY): Payer: PPO | Admitting: Certified Registered Nurse Anesthetist

## 2021-07-14 ENCOUNTER — Encounter (HOSPITAL_COMMUNITY): Payer: Self-pay | Admitting: Gastroenterology

## 2021-07-14 ENCOUNTER — Encounter (HOSPITAL_COMMUNITY)
Admission: RE | Disposition: A | Discharge status: Home / Self Care | Payer: Self-pay | Source: Ambulatory Visit | Attending: Gastroenterology

## 2021-07-14 ENCOUNTER — Other Ambulatory Visit: Payer: Self-pay

## 2021-07-14 DIAGNOSIS — K449 Diaphragmatic hernia without obstruction or gangrene: Secondary | ICD-10-CM | POA: Diagnosis not present

## 2021-07-14 DIAGNOSIS — K317 Polyp of stomach and duodenum: Secondary | ICD-10-CM | POA: Diagnosis not present

## 2021-07-14 DIAGNOSIS — N400 Enlarged prostate without lower urinary tract symptoms: Secondary | ICD-10-CM | POA: Diagnosis not present

## 2021-07-14 DIAGNOSIS — D131 Benign neoplasm of stomach: Secondary | ICD-10-CM | POA: Diagnosis not present

## 2021-07-14 DIAGNOSIS — I10 Essential (primary) hypertension: Secondary | ICD-10-CM | POA: Insufficient documentation

## 2021-07-14 DIAGNOSIS — Z87891 Personal history of nicotine dependence: Secondary | ICD-10-CM | POA: Insufficient documentation

## 2021-07-14 DIAGNOSIS — M4316 Spondylolisthesis, lumbar region: Secondary | ICD-10-CM | POA: Diagnosis not present

## 2021-07-14 DIAGNOSIS — K219 Gastro-esophageal reflux disease without esophagitis: Secondary | ICD-10-CM | POA: Diagnosis not present

## 2021-07-14 DIAGNOSIS — I493 Ventricular premature depolarization: Secondary | ICD-10-CM | POA: Diagnosis not present

## 2021-07-14 DIAGNOSIS — Z8719 Personal history of other diseases of the digestive system: Secondary | ICD-10-CM | POA: Insufficient documentation

## 2021-07-14 HISTORY — PX: HEMOSTASIS CLIP PLACEMENT: SHX6857

## 2021-07-14 HISTORY — PX: ESOPHAGOGASTRODUODENOSCOPY (EGD) WITH PROPOFOL: SHX5813

## 2021-07-14 HISTORY — PX: POLYPECTOMY: SHX5525

## 2021-07-14 HISTORY — PX: SCLEROTHERAPY: SHX6841

## 2021-07-14 LAB — POCT I-STAT, CHEM 8
BUN: 21 mg/dL (ref 8–23)
Calcium, Ion: 1.23 mmol/L (ref 1.15–1.40)
Chloride: 103 mmol/L (ref 98–111)
Creatinine, Ser: 1.2 mg/dL (ref 0.61–1.24)
Glucose, Bld: 122 mg/dL — ABNORMAL HIGH (ref 70–99)
HCT: 37 % — ABNORMAL LOW (ref 39.0–52.0)
Hemoglobin: 12.6 g/dL — ABNORMAL LOW (ref 13.0–17.0)
Potassium: 3.9 mmol/L (ref 3.5–5.1)
Sodium: 136 mmol/L (ref 135–145)
TCO2: 27 mmol/L (ref 22–32)

## 2021-07-14 SURGERY — ESOPHAGOGASTRODUODENOSCOPY (EGD) WITH PROPOFOL
Anesthesia: Monitor Anesthesia Care

## 2021-07-14 MED ORDER — SODIUM CHLORIDE (PF) 0.9 % IJ SOLN
PREFILLED_SYRINGE | INTRAMUSCULAR | Status: DC | PRN
  Administered 2021-07-14: 1 mL

## 2021-07-14 MED ORDER — LIDOCAINE 2% (20 MG/ML) 5 ML SYRINGE
INTRAMUSCULAR | Status: DC | PRN
  Administered 2021-07-14: 80 mg via INTRAVENOUS

## 2021-07-14 MED ORDER — SODIUM CHLORIDE 0.9 % IV SOLN: INTRAVENOUS | Status: DC

## 2021-07-14 MED ORDER — PROPOFOL 500 MG/50ML IV EMUL
INTRAVENOUS | Status: AC
  Filled 2021-07-14: qty 50

## 2021-07-14 MED ORDER — LACTATED RINGERS IV SOLN: INTRAVENOUS | Status: DC

## 2021-07-14 MED ORDER — PHENYLEPHRINE 40 MCG/ML (10ML) SYRINGE FOR IV PUSH (FOR BLOOD PRESSURE SUPPORT)
PREFILLED_SYRINGE | INTRAVENOUS | Status: DC | PRN
  Administered 2021-07-14: 80 ug via INTRAVENOUS

## 2021-07-14 MED ORDER — PROPOFOL 10 MG/ML IV BOLUS
INTRAVENOUS | Status: AC
  Filled 2021-07-14: qty 20

## 2021-07-14 MED ORDER — ONDANSETRON HCL 4 MG/2ML IJ SOLN
INTRAMUSCULAR | Status: DC | PRN
  Administered 2021-07-14: 4 mg via INTRAVENOUS

## 2021-07-14 MED ORDER — GLYCOPYRROLATE 0.2 MG/ML IJ SOLN
INTRAMUSCULAR | Status: DC | PRN
  Administered 2021-07-14: .2 mg via INTRAVENOUS

## 2021-07-14 MED ORDER — PROPOFOL 1000 MG/100ML IV EMUL
INTRAVENOUS | Status: AC
  Filled 2021-07-14: qty 100

## 2021-07-14 MED ORDER — PROPOFOL 500 MG/50ML IV EMUL
INTRAVENOUS | Status: DC | PRN
  Administered 2021-07-14: 150 ug/kg/min via INTRAVENOUS

## 2021-07-14 MED ORDER — LACTATED RINGERS IV SOLN
INTRAVENOUS | Status: AC | PRN
  Administered 2021-07-14: 1000 mL via INTRAVENOUS

## 2021-07-14 MED ORDER — PROPOFOL 10 MG/ML IV BOLUS
INTRAVENOUS | Status: DC | PRN
  Administered 2021-07-14: 20 mg via INTRAVENOUS

## 2021-07-14 SURGICAL SUPPLY — 15 items

## 2021-07-14 NOTE — H&P (Signed)
Carl Moody HPI: The patient returns for continued treatment of the large hyperplastic gastric polyps.  Past Medical History:  Diagnosis Date   Actinic keratosis    BPH (benign prostatic hyperplasia)    DJD (degenerative joint disease)    Gastric polyp    GERD (gastroesophageal reflux disease)    Hyperplastic colon polyp    Hypertension     Past Surgical History:  Procedure Laterality Date   BIOPSY  05/02/2021   Procedure: BIOPSY;  Surgeon: Carol Ada, MD;  Location: [facility];  Service: Endoscopy;;   CARDIAC CATHETERIZATION N/A 07/13/2015   Procedure: Left Heart Cath and Coronary Angiography;  Surgeon: Peter M Martinique, MD;  Location: [place] [facility];  Service: Cardiovascular;  Laterality: N/A;   cataracts     ESOPHAGOGASTRODUODENOSCOPY (EGD) WITH PROPOFOL N/A 05/02/2021   Procedure: ESOPHAGOGASTRODUODENOSCOPY (EGD) WITH PROPOFOL;  Surgeon: Carol Ada, MD;  Location: [facility];  Service: Endoscopy;  Laterality: N/A;   ESOPHAGOGASTRODUODENOSCOPY (EGD) WITH PROPOFOL N/A 06/09/2021   Procedure: ESOPHAGOGASTRODUODENOSCOPY (EGD) WITH PROPOFOL;  Surgeon: Carol Ada, MD;  Location: [facility];  Service: Endoscopy;  Laterality: N/A;   HEMOSTASIS CLIP PLACEMENT  06/09/2021   Procedure: HEMOSTASIS CLIP PLACEMENT;  Surgeon: Carol Ada, MD;  Location: [facility];  Service: Endoscopy;;   LIPOMA EXCISION Left 07/26/2016   Procedure: EXCISION LEFT SHOULDER LIPOMA;  Surgeon: Rolm Bookbinder, MD;  Location: [place];  Service: General;  Laterality: Left;   POLYPECTOMY  06/09/2021   Procedure: POLYPECTOMY;  Surgeon: Carol Ada, MD;  Location: [facility];  Service: Endoscopy;;   SHOULDER SURGERY Right    TONSILLECTOMY     at age 70   UPPER ESOPHAGEAL ENDOSCOPIC ULTRASOUND (EUS) N/A 05/02/2021   Procedure: UPPER ESOPHAGEAL ENDOSCOPIC ULTRASOUND (EUS);  Surgeon: Carol Ada, MD;  Location: [facility];  Service: Endoscopy;  Laterality: N/A;    History  reviewed. No pertinent family history.  Social History:  reports that he has quit smoking. He has never used smokeless tobacco. He reports current alcohol use. He reports that he does not use drugs.  Allergies: No Known Allergies  Medications: Scheduled: Continuous:  sodium chloride     lactated ringers     lactated ringers 1,000 mL (07/14/21 0951)    No results found for this or any previous visit (from the past 24 hour(s)).   No results found.  ROS:  As stated above in the HPI otherwise negative.  Blood pressure (!) 157/68, pulse 62, temperature (!) 97.5 F (36.4 C), temperature source Oral, resp. rate 19, height 5' 5.5" (1.664 m), weight 74.8 kg, SpO2 98 %.    PE: Gen: NAD, Alert and Oriented HEENT:  [place]/AT, EOMI Neck: Supple, no LAD Lungs: CTA Bilaterally CV: RRR without M/G/R ABD: Soft, NTND, +BS Ext: No C/C/E  Assessment/Plan: 1) Gastric polyps - EGD with polypectomy.  Joellen Tullos D 07/14/2021, 10:03 AM

## 2021-07-14 NOTE — Discharge Instructions (Signed)

## 2021-07-14 NOTE — Anesthesia Preprocedure Evaluation (Addendum)
Anesthesia Evaluation  Patient identified by MRN, date of birth, ID band Patient awake    Reviewed: Allergy & Precautions, NPO status , Patient's Chart, lab work & pertinent test results  Airway Mallampati: II  TM Distance: >3 FB     Dental   Pulmonary neg pulmonary ROS, former smoker,    breath sounds clear to auscultation       Cardiovascular hypertension,  Rhythm:Regular Rate:Normal     Neuro/Psych    GI/Hepatic Neg liver ROS, GERD  ,  Endo/Other  negative endocrine ROS  Renal/GU negative Renal ROS     Musculoskeletal  (+) Arthritis ,   Abdominal   Peds  Hematology   Anesthesia Other Findings   Reproductive/Obstetrics                             Anesthesia Physical Anesthesia Plan  ASA: 3  Anesthesia Plan: MAC   Post-op Pain Management:    Induction: Intravenous  PONV Risk Score and Plan: Ondansetron and Dexamethasone  Airway Management Planned: Nasal Cannula and Simple Face Mask  Additional Equipment:   Intra-op Plan:   Post-operative Plan:   Informed Consent: I have reviewed the patients History and Physical, chart, labs and discussed the procedure including the risks, benefits and alternatives for the proposed anesthesia with the patient or authorized representative who has indicated his/her understanding and acceptance.     Dental advisory given  Plan Discussed with: CRNA and Anesthesiologist  Anesthesia Plan Comments:         Anesthesia Quick Evaluation

## 2021-07-14 NOTE — Op Note (Signed)
[facility] Patient Name: Carl Moody Procedure Date: 07/14/2021 MRN: [id number] Attending MD: Carol Ada , MD Date of Birth: 08/11/1941 CSN: [id number] Age: 80 Admit Type: Outpatient Procedure:                Upper GI endoscopy Indications:              Therapeutic procedure Providers:                Carol Ada, MD, Jeanella Cara, RN,                            Fransico Setters Mbumina, Technician Referring MD:              Medicines:                Propofol per Anesthesia Complications:            No immediate complications. Estimated Blood Loss:     Estimated blood loss was minimal. Procedure:                Pre-Anesthesia Assessment:                           - Prior to the procedure, a History and Physical                            was performed, and patient medications and                            allergies were reviewed. The patient's tolerance of                            previous anesthesia was also reviewed. The risks                            and benefits of the procedure and the sedation                            options and risks were discussed with the patient.                            All questions were answered, and informed consent                            was obtained. Prior Anticoagulants: The patient has                            taken no previous anticoagulant or antiplatelet                            agents. ASA Grade Assessment: III - A patient with                            severe systemic disease. After reviewing the risks  and benefits, the patient was deemed in                            satisfactory condition to undergo the procedure.                           - Sedation was administered by an anesthesia                            professional. Deep sedation was attained.                           After obtaining informed consent, the endoscope was                            passed under direct  vision. Throughout the                            procedure, the patient's blood pressure, pulse, and                            oxygen saturations were monitored continuously. The                            EGD-OR was introduced through the mouth, and                            advanced to the second part of duodenum. The upper                            GI endoscopy was extremely difficult. The patient                            tolerated the procedure well. Scope In: Scope Out: Findings:      A 4 cm hiatal hernia was present.      Multiple 30 mm semi-sessile polyps with no bleeding and no stigmata of       recent bleeding were found in the gastric fundus. The polyp was removed       with a hot snare. Resection and retrieval were complete. To prevent       bleeding post-intervention, nine hemostatic clips were successfully       placed (MR conditional). There was no bleeding at the end of the       procedure.      The examined duodenum was normal.      The procedure was extremely difficult was the patient was not able to       hold his insufflation and the polyp was located on the greater curvature       side of the stomach just distal to the diagphragmatic incursion. An en       foss resection was not possible as the two largest polyps were eccentric       to the position of the scope. The snare was not able to capture the       polyps in this position. The retroflexed position provided the best  view, but at the point of optimal positioning the patient eructated and       all the insufllation was lost. Piecemeal resection was performed and one       polyp was able to be completely resected. Ultimately with one of the       deflation events of the stomach the snare was fortuitously able to       capture the largest polyp in its entirety. An excellent resection was       obtained. Because the mucosal defect was large, hemoclips were deployed.       A total of 9 were used, but  this still did not close the defect. All the       technical factors as described above made it impossible to close the       defect. Using a water insufflation technique allowed for an en foss       position. Careful monitoring of the water level was performed as he       collected water in the hernia sac as well as the distal esophagus. After       placement of the final clip the patient started to wretch. All the water       was quickly suctioned from the distal esophagus and the proximal       stomach. The patient did vomit up some of the water. No immediate       complications were noted. Because of this final event, no further       clipping was performed. He is at higher risk for a post polypectomy       bleed. Impression:               - 4 cm hiatal hernia.                           - Multiple gastric polyps. Resected and retrieved.                            Clips (MR conditional) were placed.                           - Normal examined duodenum. Moderate Sedation:      Not Applicable - Patient had care per Anesthesia. Recommendation:           - Patient has a contact number available for                            emergencies. The signs and symptoms of potential                            delayed complications were discussed with the                            patient. Return to normal activities tomorrow.                            Written discharge instructions were provided to the                            patient.                           -  Resume previous diet.                           - Continue present medications.                           - Return to GI clinic in 4 weeks. Procedure Code(s):        --- Professional ---                           [phone], Esophagogastroduodenoscopy, flexible,                            transoral; with removal of tumor(s), polyp(s), or                            other lesion(s) by snare technique Diagnosis Code(s):        ---  Professional ---                           K31.7, Polyp of stomach and duodenum                           K44.9, Diaphragmatic hernia without obstruction or                            gangrene CPT copyright 2019 [employer]. All rights reserved. The codes documented in this report are preliminary and upon coder review may  be revised to meet current compliance requirements. Carol Ada, MD Carol Ada, MD 07/14/2021 12:22:12 PM This report has been signed electronically. Number of Addenda: 0

## 2021-07-14 NOTE — Transfer of Care (Signed)
Immediate Anesthesia Transfer of Care Note  Patient: Carl Moody  Procedure(s) Performed: ESOPHAGOGASTRODUODENOSCOPY (EGD) WITH PROPOFOL POLYPECTOMY SCLEROTHERAPY HEMOSTASIS CLIP PLACEMENT  Patient Location: PACU and Endoscopy Unit  Anesthesia Type:MAC  Level of Consciousness: awake, alert  and patient cooperative  Airway & Oxygen Therapy: Patient Spontanous Breathing and Patient connected to face mask oxygen  Post-op Assessment: Report given to RN and Post -op Vital signs reviewed and stable  Post vital signs: Reviewed and stable  Last Vitals:  Vitals Value Taken Time  BP 144/72 07/14/21 1226  Temp    Pulse 56 07/14/21 1229  Resp 15 07/14/21 1229  SpO2 100 % 07/14/21 1229  Vitals shown include unvalidated device data.  Last Pain:  Vitals:   07/14/21 0941  TempSrc: Oral  PainSc: 0-No pain         Complications: No notable events documented.

## 2021-07-14 NOTE — Progress Notes (Signed)
Post procedure the patient complained of abdominal pain.  He rated the pain as a 5/10 and it is stable.  This is not an unbearable pain for him.  The resection of the polyp was very good as the submucosa after the resection was identified.  There is a lower likelihood of perforation, but this may still occur.  The other significant concern is bleeding.  He was offered an admission for observation, but he desires to go home.  A follow up phone call will be made later today as well as tomorrow.  He knows that I am on call this weekend.

## 2021-07-14 NOTE — Anesthesia Procedure Notes (Signed)
Procedure Name: MAC Date/Time: 07/14/2021 10:10 AM Performed by: West Pugh, CRNA Pre-anesthesia Checklist: Patient identified, Emergency Drugs available, Suction available, Patient being monitored and Timeout performed Patient Re-evaluated:Patient Re-evaluated prior to induction Oxygen Delivery Method: Simple face mask Preoxygenation: Pre-oxygenation with 100% oxygen Induction Type: IV induction Placement Confirmation: positive ETCO2 Dental Injury: Teeth and Oropharynx as per pre-operative assessment

## 2021-07-16 NOTE — Anesthesia Postprocedure Evaluation (Signed)
Anesthesia Post Note  Patient: Carl Moody  Procedure(s) Performed: ESOPHAGOGASTRODUODENOSCOPY (EGD) WITH PROPOFOL POLYPECTOMY [place]     Patient location during evaluation: Endoscopy Anesthesia Type: MAC Level of consciousness: awake Pain management: pain level controlled Vital Signs Assessment: post-procedure vital signs reviewed and stable Cardiovascular status: stable Postop Assessment: no apparent nausea or vomiting Anesthetic complications: no   No notable events documented.  Last Vitals:  Vitals:   07/14/21 1300 07/14/21 1310  BP: (!) 165/68 (!) 170/69  Pulse: (!) 48 (!) 44  Resp: 12 (!) 9  Temp:    SpO2: 99% 100%    Last Pain:  Vitals:   07/14/21 1300  TempSrc:   PainSc: 5                  [name]

## 2021-07-19 DIAGNOSIS — Z23 Encounter for immunization: Secondary | ICD-10-CM | POA: Diagnosis not present

## 2021-07-19 DIAGNOSIS — R82998 Other abnormal findings in urine: Secondary | ICD-10-CM | POA: Diagnosis not present

## 2021-07-19 DIAGNOSIS — E785 Hyperlipidemia, unspecified: Secondary | ICD-10-CM | POA: Diagnosis not present

## 2021-07-19 DIAGNOSIS — I129 Hypertensive chronic kidney disease with stage 1 through stage 4 chronic kidney disease, or unspecified chronic kidney disease: Secondary | ICD-10-CM | POA: Diagnosis not present

## 2021-07-19 DIAGNOSIS — Z0000 Encounter for general adult medical examination without abnormal findings: Secondary | ICD-10-CM | POA: Diagnosis not present

## 2021-07-19 DIAGNOSIS — K635 Polyp of colon: Secondary | ICD-10-CM | POA: Diagnosis not present

## 2021-07-19 DIAGNOSIS — K219 Gastro-esophageal reflux disease without esophagitis: Secondary | ICD-10-CM | POA: Diagnosis not present

## 2021-07-19 DIAGNOSIS — R42 Dizziness and giddiness: Secondary | ICD-10-CM | POA: Diagnosis not present

## 2021-07-19 DIAGNOSIS — G43109 Migraine with aura, not intractable, without status migrainosus: Secondary | ICD-10-CM | POA: Diagnosis not present

## 2021-07-19 DIAGNOSIS — N1831 Chronic kidney disease, stage 3a: Secondary | ICD-10-CM | POA: Diagnosis not present

## 2021-07-19 DIAGNOSIS — M1990 Unspecified osteoarthritis, unspecified site: Secondary | ICD-10-CM | POA: Diagnosis not present

## 2021-08-01 ENCOUNTER — Other Ambulatory Visit: Payer: Self-pay | Admitting: Physician Assistant

## 2021-08-01 DIAGNOSIS — L299 Pruritus, unspecified: Secondary | ICD-10-CM

## 2021-08-17 DIAGNOSIS — K5730 Diverticulosis of large intestine without perforation or abscess without bleeding: Secondary | ICD-10-CM | POA: Diagnosis not present

## 2021-08-17 DIAGNOSIS — K317 Polyp of stomach and duodenum: Secondary | ICD-10-CM | POA: Diagnosis not present

## 2021-08-17 DIAGNOSIS — K219 Gastro-esophageal reflux disease without esophagitis: Secondary | ICD-10-CM | POA: Diagnosis not present

## 2021-08-17 DIAGNOSIS — K449 Diaphragmatic hernia without obstruction or gangrene: Secondary | ICD-10-CM | POA: Diagnosis not present

## 2021-09-29 DIAGNOSIS — M4316 Spondylolisthesis, lumbar region: Secondary | ICD-10-CM | POA: Diagnosis not present

## 2021-10-23 DIAGNOSIS — H524 Presbyopia: Secondary | ICD-10-CM | POA: Diagnosis not present

## 2021-10-31 ENCOUNTER — Other Ambulatory Visit: Payer: Self-pay

## 2021-10-31 ENCOUNTER — Other Ambulatory Visit: Payer: Self-pay | Admitting: Podiatry

## 2021-10-31 ENCOUNTER — Ambulatory Visit (INDEPENDENT_AMBULATORY_CARE_PROVIDER_SITE_OTHER): Payer: Medicare Other

## 2021-10-31 ENCOUNTER — Encounter: Payer: Self-pay | Admitting: Podiatry

## 2021-10-31 ENCOUNTER — Ambulatory Visit: Payer: Medicare Other | Admitting: Podiatry

## 2021-10-31 DIAGNOSIS — M2012 Hallux valgus (acquired), left foot: Secondary | ICD-10-CM

## 2021-10-31 DIAGNOSIS — K5730 Diverticulosis of large intestine without perforation or abscess without bleeding: Secondary | ICD-10-CM | POA: Insufficient documentation

## 2021-10-31 DIAGNOSIS — D131 Benign neoplasm of stomach: Secondary | ICD-10-CM | POA: Insufficient documentation

## 2021-10-31 DIAGNOSIS — M2042 Other hammer toe(s) (acquired), left foot: Secondary | ICD-10-CM

## 2021-10-31 NOTE — Progress Notes (Incomplete)
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 male presents with the above complaint.   ROS: Denies fever chills nausea vomiting muscle aches pains calf pain back pain chest pain shortness of breath.  Past Medical History:  Diagnosis Date   Actinic keratosis    BPH (benign prostatic hyperplasia)    DJD (degenerative joint disease)    Gastric polyp    GERD (gastroesophageal reflux disease)    Hyperplastic colon polyp    Hypertension    Past Surgical History:  Procedure Laterality Date   BIOPSY  05/02/2021   Procedure: BIOPSY;  Surgeon: Carol Ada, MD;  Location: [facility];  Service: Endoscopy;;   CARDIAC CATHETERIZATION N/A 07/13/2015   Procedure: Left Heart Cath and Coronary Angiography;  Surgeon: Peter M Martinique, MD;  Location: [place] [facility];  Service: Cardiovascular;  Laterality: N/A;   cataracts     ESOPHAGOGASTRODUODENOSCOPY (EGD) WITH PROPOFOL N/A 05/02/2021   Procedure: ESOPHAGOGASTRODUODENOSCOPY (EGD) WITH PROPOFOL;  Surgeon: Carol Ada, MD;  Location: [facility];  Service: Endoscopy;  Laterality: N/A;   ESOPHAGOGASTRODUODENOSCOPY (EGD) WITH PROPOFOL N/A 06/09/2021   Procedure: ESOPHAGOGASTRODUODENOSCOPY (EGD) WITH PROPOFOL;  Surgeon: Carol Ada, MD;  Location: [facility];  Service: Endoscopy;  Laterality: N/A;   ESOPHAGOGASTRODUODENOSCOPY (EGD) WITH PROPOFOL N/A 07/14/2021   Procedure: ESOPHAGOGASTRODUODENOSCOPY (EGD) WITH PROPOFOL;  Surgeon: Carol Ada, MD;  Location: [facility];  Service: Endoscopy;  Laterality: N/A;   HEMOSTASIS CLIP PLACEMENT  06/09/2021   Procedure: HEMOSTASIS CLIP PLACEMENT;  Surgeon: Carol Ada, MD;  Location: [facility];  Service: Endoscopy;;   HEMOSTASIS  CLIP PLACEMENT  07/14/2021   Procedure: HEMOSTASIS CLIP PLACEMENT;  Surgeon: Carol Ada, MD;  Location: [facility];  Service: Endoscopy;;   LIPOMA EXCISION Left 07/26/2016   Procedure: EXCISION LEFT SHOULDER LIPOMA;  Surgeon: Rolm Bookbinder, MD;  Location: [place];  Service: General;  Laterality: Left;   POLYPECTOMY  06/09/2021   Procedure: POLYPECTOMY;  Surgeon: Carol Ada, MD;  [facility]: [facility];  Service: Endoscopy;;   POLYPECTOMY  07/14/2021   Procedure: POLYPECTOMY;  Surgeon: Carol Ada, MD;  [facility]: [facility];  Service: Endoscopy;;   SCLEROTHERAPY  07/14/2021   Procedure: Clide Deutscher;  Surgeon: Carol Ada, MD;  [facility]: [facility];  Service: Endoscopy;;   SHOULDER SURGERY Right    TONSILLECTOMY     at age 61   UPPER ESOPHAGEAL ENDOSCOPIC ULTRASOUND (EUS) N/A 05/02/2021   Procedure: UPPER ESOPHAGEAL ENDOSCOPIC ULTRASOUND (EUS);  Surgeon: Carol Ada, MD;  Location: [facility];  Service: Endoscopy;  Laterality: N/A;    Current Outpatient Medications:    acetaminophen (TYLENOL) 500 MG tablet, Take 500 mg by mouth every 6 (six) hours as needed for moderate pain., Disp: , Rfl:    aspirin EC 81 MG tablet, Take 81 mg by mouth at bedtime., Disp: , Rfl:    Carboxymethylcellul-Glycerin (LUBRICATING EYE DROPS OP), Place 1 drop into both eyes daily as needed (dry eyes)., Disp: , Rfl:    chlorthalidone (HYGROTON) 25 MG tablet, Take 25 mg by mouth daily., Disp: , Rfl:    Cholecalciferol (VITAMIN D3) 50 MCG (2000 UT) capsule, Take 2,000 Units by mouth daily., Disp: , Rfl:    irbesartan (AVAPRO) 150 MG  tablet, Take 150 mg by mouth daily., Disp: , Rfl:    omeprazole (PRILOSEC) 20 MG capsule, Take 20 mg by mouth 2 (two) times daily., Disp: , Rfl:    traMADol (ULTRAM) 50 MG tablet, , Disp: , Rfl:    TURMERIC-GINGER PO, Take 1 tablet by mouth daily., Disp: , Rfl:   No Known Allergies Review of Systems Objective:  There were no vitals  filed for this visit.  General: Well developed, nourished, in no acute distress, alert and oriented x3   Dermatological: Skin is warm, dry and supple bilateral. Nails x 10 are well maintained; remaining integument appears unremarkable at this time. There are no open sores, no preulcerative lesions, no rash or signs of infection present.  Vascular: Dorsalis Pedis artery and Posterior Tibial artery pedal pulses are 2/4 bilateral with immedate capillary fill time. Pedal hair growth present. No varicosities and no lower extremity edema present bilateral.   Neruologic: Grossly intact via light touch bilateral. Vibratory intact via tuning fork bilateral. Protective threshold with Semmes Wienstein monofilament intact to all pedal sites bilateral. Patellar and Achilles deep tendon reflexes 2+ bilateral. No Babinski or clonus noted bilateral.   Musculoskeletal: No gross boney pedal deformities bilateral. No pain, crepitus, or limitation noted with foot and ankle range of motion bilateral. Muscular strength 5/5 in all groups tested bilateral.  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    Radiographs:  ***  Assessment & Plan:   Assessment: ***  Plan: ***     Akasia Ahmad T. [place], [place]

## 2021-10-31 NOTE — Progress Notes (Signed)
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 male presents with the above complaint.   ROS: Denies fever chills nausea vibe muscle aches pains calf pain back pain chest pain shortness of breath.  Past Medical History:  Diagnosis Date   Actinic keratosis    BPH (benign prostatic hyperplasia)    DJD (degenerative joint disease)    Gastric polyp    GERD (gastroesophageal reflux disease)    Hyperplastic colon polyp    Hypertension    Past Surgical History:  Procedure Laterality Date   BIOPSY  05/02/2021   Procedure: BIOPSY;  Surgeon: Carol Ada, MD;  [facility]: [facility];  Service: Endoscopy;;   CARDIAC CATHETERIZATION N/A 07/13/2015   Procedure: Left Heart Cath and Coronary Angiography;  Surgeon: Peter M Martinique, MD;  Location: [facility];  Service: Cardiovascular;  Laterality: N/A;   cataracts     ESOPHAGOGASTRODUODENOSCOPY (EGD) WITH PROPOFOL N/A 05/02/2021   Procedure: ESOPHAGOGASTRODUODENOSCOPY (EGD) WITH PROPOFOL;  Surgeon: Carol Ada, MD;  [facility];  Service: Endoscopy;  Laterality: N/A;   ESOPHAGOGASTRODUODENOSCOPY (EGD) WITH PROPOFOL N/A 06/09/2021   Procedure: ESOPHAGOGASTRODUODENOSCOPY (EGD) WITH PROPOFOL;  Surgeon: Carol Ada, MD;  [facility];  Service: Endoscopy;  Laterality: N/A;   ESOPHAGOGASTRODUODENOSCOPY (EGD) WITH PROPOFOL N/A 07/14/2021   Procedure: ESOPHAGOGASTRODUODENOSCOPY (EGD) WITH PROPOFOL;  Surgeon: Carol Ada, MD;  [facility];  Service: Endoscopy;  Laterality: N/A;   HEMOSTASIS CLIP PLACEMENT  06/09/2021   Procedure: HEMOSTASIS CLIP PLACEMENT;  Surgeon: Carol Ada, MD;  [facility];  Service: Endoscopy;;   HEMOSTASIS CLIP PLACEMENT   07/14/2021   Procedure: HEMOSTASIS CLIP PLACEMENT;  Surgeon: Carol Ada, MD;  [facility];  Service: Endoscopy;;   LIPOMA EXCISION Left 07/26/2016   Procedure: EXCISION LEFT SHOULDER LIPOMA;  Surgeon: Rolm Bookbinder, MD;  Location: [place];  Service: General;  Laterality: Left;   POLYPECTOMY  06/09/2021   Procedure: POLYPECTOMY;  Surgeon: Carol Ada, MD;  [facility];  Service: Endoscopy;;   POLYPECTOMY  07/14/2021   Procedure: POLYPECTOMY;  Surgeon: Carol Ada, MD;  [facility];  Service: Endoscopy;;   SCLEROTHERAPY  07/14/2021   Procedure: Clide Deutscher;  Surgeon: Carol Ada, MD;  [facility];  Service: Endoscopy;;   SHOULDER SURGERY Right    TONSILLECTOMY     at age 61   UPPER ESOPHAGEAL ENDOSCOPIC ULTRASOUND (EUS) N/A 05/02/2021   Procedure: UPPER ESOPHAGEAL ENDOSCOPIC ULTRASOUND (EUS);  Surgeon: Carol Ada, MD;  Location: [facility];  Service: Endoscopy;  Laterality: N/A;    Current Outpatient Medications:    acetaminophen (TYLENOL) 500 MG tablet, Take 500 mg by mouth every 6 (six) hours as needed for moderate pain., Disp: , Rfl:    aspirin EC 81 MG tablet, Take 81 mg by mouth at bedtime., Disp: , Rfl:    Carboxymethylcellul-Glycerin (LUBRICATING EYE DROPS OP), Place 1 drop into both eyes daily as needed (dry eyes)., Disp: , Rfl:    chlorthalidone (HYGROTON) 25 MG tablet, Take 25 mg by mouth daily., Disp: , Rfl:    Cholecalciferol (VITAMIN D3) 50 MCG (2000 UT) capsule, Take 2,000 Units by mouth daily., Disp: , Rfl:    irbesartan (AVAPRO) 150 MG  tablet, Take 150 mg by mouth daily., Disp: , Rfl:    omeprazole (PRILOSEC) 20 MG capsule, Take 20 mg by mouth 2 (two) times daily., Disp: , Rfl:    traMADol (ULTRAM) 50 MG tablet, , Disp: , Rfl:    TURMERIC-GINGER PO, Take 1 tablet by mouth daily., Disp: , Rfl:   No Known Allergies Review of Systems Objective:  There were no vitals filed for this visit.  General:  Well developed, nourished, in no acute distress, alert and oriented x3   Dermatological: Skin is warm, dry and supple bilateral. Nails x 10 are well maintained; remaining integument appears unremarkable at this time. There are no open sores, no preulcerative lesions, no rash or signs of infection present.  Vascular: Dorsalis Pedis artery and Posterior Tibial artery pedal pulses are 2/4 bilateral with immedate capillary fill time. Pedal hair growth present. No varicosities and no lower extremity edema present bilateral.   Neruologic: Grossly intact via light touch bilateral. Vibratory intact via tuning fork bilateral. Protective threshold with Semmes Wienstein monofilament intact to all pedal sites bilateral. Patellar and Achilles deep tendon reflexes 2+ bilateral. No Babinski or clonus noted bilateral.   Musculoskeletal: No gross boney pedal deformities bilateral. No pain, crepitus, or limitation noted with foot and ankle range of motion bilateral. Muscular strength 5/5 in all groups tested bilateral.  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  Mild reducible.  No hammertoe deformity.  Gait: Unassisted, Nonantalgic.    Radiographs:  Radiographs taken today demonstrate an osseously mature individual with an increase in the first intermetatarsal angle angle greater than 18 degrees hallux abductus angle nearly completely dislocated laterally.  Greater than 30 degrees.  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    Plan: Discussed etiology pathology and surgical therapies at this point conservative therapies have failed to render him asymptomatic so at this point he is going to request surgical intervention consisting of a Lapidus procedure and with bunionectomy and screw plates and  screws or staples.  He will also receive a cast below knee.  And I will follow-up with him in surgery.  We did discuss possible postop complications which may include but not limited to postop pain bleeding swell infection recurrence need further surgery overcorrection Dr. Caryl Comes should also digit loss of limb loss of life.     Carys Malina T. [place], [place]

## 2021-11-13 ENCOUNTER — Telehealth: Payer: Self-pay | Admitting: Urology

## 2021-11-13 NOTE — Telephone Encounter (Signed)
DOS - 12/08/21  LAPIDUS PROC. INC.BUNIONECTOMY LEFT --- [id number]  [employer] EFFECTIVE DATE - 10/08/21  PLAN DEDUCTIBLE - $0.00 OUT OF POCKET - $5,650.00 W/ $5,585.00 REMAINING COINSURANCE - 0% COPAY - $200.00  NO PRIOR AUTH IS REQUIRED

## 2021-12-06 ENCOUNTER — Other Ambulatory Visit: Payer: Self-pay | Admitting: Podiatry

## 2021-12-06 MED ORDER — ONDANSETRON HCL 4 MG PO TABS: 4.0000 mg | ORAL_TABLET | Freq: Three times a day (TID) | ORAL | 0 refills | Status: AC | PRN

## 2021-12-06 MED ORDER — OXYCODONE-ACETAMINOPHEN 10-325 MG PO TABS: 1.0000 | ORAL_TABLET | Freq: Three times a day (TID) | ORAL | 0 refills | Status: AC | PRN

## 2021-12-06 MED ORDER — CEPHALEXIN 500 MG PO CAPS: 500.0000 mg | ORAL_CAPSULE | Freq: Three times a day (TID) | ORAL | 0 refills | Status: DC

## 2021-12-08 DIAGNOSIS — M25572 Pain in left ankle and joints of left foot: Secondary | ICD-10-CM | POA: Diagnosis not present

## 2021-12-08 DIAGNOSIS — M2012 Hallux valgus (acquired), left foot: Secondary | ICD-10-CM | POA: Diagnosis not present

## 2021-12-08 DIAGNOSIS — M205X2 Other deformities of toe(s) (acquired), left foot: Secondary | ICD-10-CM | POA: Diagnosis not present

## 2021-12-08 DIAGNOSIS — M21612 Bunion of left foot: Secondary | ICD-10-CM | POA: Diagnosis not present

## 2021-12-14 ENCOUNTER — Encounter: Payer: Self-pay | Admitting: Podiatry

## 2021-12-14 ENCOUNTER — Ambulatory Visit (INDEPENDENT_AMBULATORY_CARE_PROVIDER_SITE_OTHER): Payer: Medicare Other

## 2021-12-14 ENCOUNTER — Ambulatory Visit (INDEPENDENT_AMBULATORY_CARE_PROVIDER_SITE_OTHER): Payer: Medicare Other | Admitting: Podiatry

## 2021-12-14 ENCOUNTER — Other Ambulatory Visit: Payer: Self-pay

## 2021-12-14 VITALS — BP 138/63 | HR 65 | Temp 97.5°F

## 2021-12-14 DIAGNOSIS — M2012 Hallux valgus (acquired), left foot: Secondary | ICD-10-CM | POA: Diagnosis not present

## 2021-12-14 DIAGNOSIS — Z9889 Other specified postprocedural states: Secondary | ICD-10-CM

## 2021-12-14 NOTE — Progress Notes (Signed)
He presents today for his first postop visit date of surgery is 12/08/2021 status post Lapidus procedure and a cast.  He states that everything is going good has had no pain in his foot whatsoever however he has had pain in his knee and is unable to ride the knee scooter.  Denies calf pain back pain chest pain shortness of breath. ? ?Objective: Vital signs are stable alert and oriented x3.  Popliteal pulses palpable.  He is able to move his toes they are warm and red and had good sensation.  Cast is intact dry and clean.  Radiographs taken today demonstrate well-healing fusion site. ? ?Assessment well-healing surgical foot. ? ?Plan: I will have him back in 1 week for a cast change. ?

## 2021-12-21 ENCOUNTER — Encounter: Payer: Self-pay | Admitting: Podiatry

## 2021-12-21 ENCOUNTER — Ambulatory Visit (INDEPENDENT_AMBULATORY_CARE_PROVIDER_SITE_OTHER): Payer: Medicare Other | Admitting: Podiatry

## 2021-12-21 ENCOUNTER — Other Ambulatory Visit: Payer: Self-pay

## 2021-12-21 VITALS — BP 144/68 | HR 70 | Temp 97.5°F

## 2021-12-21 DIAGNOSIS — M2012 Hallux valgus (acquired), left foot: Secondary | ICD-10-CM

## 2021-12-21 DIAGNOSIS — Z9889 Other specified postprocedural states: Secondary | ICD-10-CM

## 2021-12-21 NOTE — Progress Notes (Signed)
He presents today for his second postop visit date of surgery 12/08/2021 Lapidus procedure left foot with screws and staples.  Cast application.  He states he feels fine he is even feeling better.  Denies fever chills nausea vomiting muscle aches and pains. ? ?Objective: Vital signs are stable alert and oriented x3.  Pulses are palpable.  Cast was intact dry and clean was removed demonstrates dry and clean dressing was removed demonstrates mild edema no erythema cellulitis drainage or odor incision site appears to be healing very nicely does have some eschar distally.  He has a little bit of the lateral deviation of the toe but as long as the juxtaposition of the second and the first are good not concerned. ? ?Assessment: Well-healing surgical foot. ? ?Plan: Redressed today dressed a compressive dressing and reapplied a below-knee fiberglass cast continue nonweightbearing status continue elevation continue ice continue aspirin and I will follow-up with him in 2 weeks.  X-rays will be taken at that time. ?

## 2022-01-04 ENCOUNTER — Encounter: Payer: Self-pay | Admitting: Podiatry

## 2022-01-04 ENCOUNTER — Ambulatory Visit (INDEPENDENT_AMBULATORY_CARE_PROVIDER_SITE_OTHER): Payer: Medicare Other

## 2022-01-04 ENCOUNTER — Ambulatory Visit (INDEPENDENT_AMBULATORY_CARE_PROVIDER_SITE_OTHER): Payer: Medicare Other | Admitting: Podiatry

## 2022-01-04 DIAGNOSIS — Z9889 Other specified postprocedural states: Secondary | ICD-10-CM

## 2022-01-04 DIAGNOSIS — M2012 Hallux valgus (acquired), left foot: Secondary | ICD-10-CM

## 2022-01-04 NOTE — Progress Notes (Signed)
He presents today date of surgery December 08, 2021 Lapidus bunion repair with screws.  He states that he is doing quite well has not had any problems. ? ?Objective: He presents today in a wheelchair with his cast intact once removed demonstrates no problem with his dressing dressing was removed sutures are intact margins well coapted has a little scab at the distalmost aspect of the incision but no erythema no signs of infection at this point. ? ?Radiographs taken today demonstrate a well-placed first metatarsal cuneiform joint arthrodesis and screws are intact and the bone appears to be healing. ? ?Assessment: Well-healing surgical toe and foot. ? ?Plan: I will put him in a cam walker today continue nonweightbearing status I will follow-up with him in about 2 weeks for another set of x-rays ?

## 2022-01-18 ENCOUNTER — Ambulatory Visit: Payer: Medicare Other

## 2022-01-18 ENCOUNTER — Ambulatory Visit (INDEPENDENT_AMBULATORY_CARE_PROVIDER_SITE_OTHER): Payer: Medicare Other | Admitting: Sports Medicine

## 2022-01-18 ENCOUNTER — Encounter: Payer: Self-pay | Admitting: Sports Medicine

## 2022-01-18 DIAGNOSIS — Z9889 Other specified postprocedural states: Secondary | ICD-10-CM

## 2022-01-18 DIAGNOSIS — M2012 Hallux valgus (acquired), left foot: Secondary | ICD-10-CM

## 2022-01-18 NOTE — Progress Notes (Signed)
Subjective: ?Carl Moody is a 81 y.o. male patient seen today in office for postoperative evaluation.  Patient is a patient of Dr. Milinda Pointer and had Lapidus bunionectomy repair with internal screws performed on 12/08/2021.  Patient reports that he is doing good denies any pain denies any nausea vomiting fever or chills or any other constitutional symptoms at this time. ? ?Patient Active Problem List  ? Diagnosis Date Noted  ? 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 Injury of finger of left hand 11/27/2018  ? Localized, primary osteoarthritis of hand 11/27/2018  ? Pain of left hand 11/27/2018  ? 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 Abnormal stress test 07/13/2015  ? Exertional chest pain 06/08/2015  ? PVC's (premature ventricular contractions) 07/22/2013  ? ? ?Current Outpatient Medications on File Prior to Visit  ?Medication Sig Dispense Refill  ? acetaminophen (TYLENOL) 500 MG tablet Take 500 mg by mouth every 6 (six) hours as needed for moderate pain.    ? aspirin EC 81 MG tablet Take 81 mg by mouth at bedtime.    ? Carboxymethylcellul-Glycerin (LUBRICATING EYE DROPS OP) Place 1 drop into both eyes daily as needed (dry eyes).    ? chlorthalidone (HYGROTON) 25 MG tablet Take 25 mg by mouth daily.    ? Cholecalciferol (VITAMIN D3) 50 MCG (2000 UT) capsule Take 2,000 Units by mouth daily.    ? irbesartan (AVAPRO) 150 MG tablet Take 150 mg by mouth daily.    ? omeprazole (PRILOSEC) 20 MG capsule Take 20 mg by mouth 2 (two) times daily.    ? ondansetron (ZOFRAN) 4 MG tablet Take 1 tablet (4 mg total) by mouth every 8 (eight) hours as needed. 20 tablet 0  ? traMADol (ULTRAM) 50 MG tablet     ? TURMERIC-GINGER PO Take 1 tablet by mouth daily.    ? ?No current facility-administered medications on file prior to visit.  ? ? ?No Known Allergies ? ?Objective: ?There were no vitals filed for this visit. ? ?General: No acute distress, AAOx3  ?Left foot surgical site healing  well with minimal scab distally, diffuse edema noted to the left foot, capillary fill time <3 seconds in all digits, gross sensation present via light touch to left foot.Marland Kitchen No pain or crepitation with range of motion left foot.  No pain with calf compression.  ? ?Post Op Xray, Left foot: Acceptable alignment and position first metatarsal. Osteotomy site healing. Hardware intact.  Soft tissue swelling consistent with  post op status.  ? ?Assessment and Plan:  ?Problem List Items Addressed This Visit   ?None ?Visit Diagnoses   ? ? Status post left foot surgery    -  Primary  ? Relevant Orders  ? DG Foot Complete Left  ? Hav (hallux abducto valgus), left      ? ?  ?  ? ?-Patient seen and evaluated ?-X-rays reviewed as above ?-Advised patient he may occasionally once or twice a week and use a small amount of Neosporin at the scab at the incision ?-Advised patient to continue 2 more weeks of nonweightbearing out of precaution and cam boot at next visit Dr. Milinda Pointer will evaluate him to see if he can start weightbearing ?-Advised patient to ice and elevate as necessary and continue with Surgigrip compression sleeve for edema control ?-Will plan for postoperative check with Dr. Milinda Pointer at next visit. ? ?Landis Martins, DPM ? ?

## 2022-02-01 ENCOUNTER — Ambulatory Visit (INDEPENDENT_AMBULATORY_CARE_PROVIDER_SITE_OTHER): Payer: Medicare Other | Admitting: Podiatry

## 2022-02-01 ENCOUNTER — Encounter: Payer: Self-pay | Admitting: Podiatry

## 2022-02-01 ENCOUNTER — Ambulatory Visit (INDEPENDENT_AMBULATORY_CARE_PROVIDER_SITE_OTHER): Payer: Medicare Other

## 2022-02-01 DIAGNOSIS — Z9889 Other specified postprocedural states: Secondary | ICD-10-CM

## 2022-02-01 DIAGNOSIS — M2012 Hallux valgus (acquired), left foot: Secondary | ICD-10-CM

## 2022-02-01 NOTE — Progress Notes (Signed)
Carl Moody presents today for follow-up of his Lapidus that was performed on December 08, 2021 states that that he is try to stay off of his much as he can continue to use his cam boot and his crutches.  He states is little swollen but other than that is doing fine. ? ?Objective: Vital signs are stable he is alert and oriented x3.  He still has some some residual residual hallux valgus but his first ray is very straight.  He is happy with that.  States he has no pain on palpation or range of motion.  Radiographs taken today do demonstrate some what appears to be loosening or gapping of the first metatarsal cuneiform articulation at the fusion site. ? ?Assessment: I encouraged him to continue to use his cam boot and not walk without it.  I encouraged him to use of his crutches.  We will follow-up with him in about a month for another set of x-rays. ?

## 2022-02-22 ENCOUNTER — Ambulatory Visit: Payer: Medicare Other | Admitting: Physician Assistant

## 2022-02-22 ENCOUNTER — Encounter: Payer: Self-pay | Admitting: Physician Assistant

## 2022-02-22 DIAGNOSIS — L570 Actinic keratosis: Secondary | ICD-10-CM | POA: Diagnosis not present

## 2022-02-22 DIAGNOSIS — S6992XA Unspecified injury of left wrist, hand and finger(s), initial encounter: Secondary | ICD-10-CM | POA: Diagnosis not present

## 2022-02-22 DIAGNOSIS — T1490XA Injury, unspecified, initial encounter: Secondary | ICD-10-CM

## 2022-02-22 DIAGNOSIS — Z1283 Encounter for screening for malignant neoplasm of skin: Secondary | ICD-10-CM | POA: Diagnosis not present

## 2022-02-22 NOTE — Progress Notes (Signed)
   Follow-Up Visit   Subjective  Carl Moody is a 81 y.o. male who presents for the following: Annual Exam (Here for annual skin exam. Concerns top of scalp and side. They itch per patient. ).  Left hand, fourth fingernail has some dark spots in it. No recollection of injury.    The following portions of the chart were reviewed this encounter and updated as appropriate:  Tobacco  Allergies  Meds  Problems  Med Hx  Surg Hx  Fam Hx      Objective  Well appearing patient in no apparent distress; mood and affect are within normal limits.  All skin waist up examined.  Waist up skin examination- No atypical nevi or signs of NMSC noted at the time of the visit.   Left Mid Helix, Left Parietal Scalp, Mid Forehead Erythematous patches with gritty scale.  Left 4th Finger Lateral Paronychium 3cm from cuticle - dark macule in nail bed.  Negative Hutchinson's      Assessment & Plan  AK (actinic keratosis) (3) Left Mid Helix; Left Parietal Scalp; Mid Forehead  Destruction of lesion - Left Mid Helix, Left Parietal Scalp, Mid Forehead Complexity: simple   Destruction method: cryotherapy   Informed consent: discussed and consent obtained   Timeout:  patient name, date of birth, surgical site, and procedure verified Lesion destroyed using liquid nitrogen: Yes   Cryotherapy cycles:  3 Outcome: patient tolerated procedure well with no complications    Injury Left 4th Finger Lateral Paronychium  Observe for changes. Recheck 4 months  Encounter for screening for malignant neoplasm of skin  Yearly skin examinations    I, Satya Bohall, PA-C, have reviewed all documentation's for this visit.  The documentation on 02/22/22 for the exam, diagnosis, procedures and orders are all accurate and complete.

## 2022-02-27 ENCOUNTER — Ambulatory Visit (INDEPENDENT_AMBULATORY_CARE_PROVIDER_SITE_OTHER): Payer: Medicare Other

## 2022-02-27 ENCOUNTER — Ambulatory Visit (INDEPENDENT_AMBULATORY_CARE_PROVIDER_SITE_OTHER): Payer: Medicare Other | Admitting: Podiatry

## 2022-02-27 DIAGNOSIS — M2012 Hallux valgus (acquired), left foot: Secondary | ICD-10-CM

## 2022-02-27 DIAGNOSIS — Z9889 Other specified postprocedural states: Secondary | ICD-10-CM

## 2022-02-27 NOTE — Progress Notes (Signed)
He presents today date of surgery December 08, 2021 Lapidus procedure.  States that has been perfectly fine and not having any problems.  Continues to wear the boot at all times.  Has not been barefoot has not been and any type of other shoe.  Objective: Vital signs are stable he is alert oriented x3.  Pulses are palpable.  He has had some recurrence of the hallux valgus deformity but not nearly as bad as it was previously.  His first metatarsal is remaining rectus.  No pain on attempted range of motion of the fusion site no pain on range of motion of the toe.  Radiographs taken today demonstrate an osseously mature individual though it does demonstrate some mild lucency around the screws and some areas of what appears to be consolidation and lucency in the fusion site.  Assessment well-healing Lapidus left.  Plan: We will allow him to get back into a pair of tennis shoes he is done up to walk barefoot.  I will follow-up with him in 1 month for another set of x-rays just to make sure that he is consolidating.

## 2022-04-03 ENCOUNTER — Ambulatory Visit: Payer: Medicare Other | Admitting: Podiatry

## 2022-04-03 ENCOUNTER — Encounter: Payer: Self-pay | Admitting: Podiatry

## 2022-04-03 ENCOUNTER — Ambulatory Visit (INDEPENDENT_AMBULATORY_CARE_PROVIDER_SITE_OTHER): Payer: Medicare Other

## 2022-04-03 DIAGNOSIS — Z9889 Other specified postprocedural states: Secondary | ICD-10-CM

## 2022-04-03 DIAGNOSIS — M2012 Hallux valgus (acquired), left foot: Secondary | ICD-10-CM

## 2022-04-03 DIAGNOSIS — M960 Pseudarthrosis after fusion or arthrodesis: Secondary | ICD-10-CM

## 2022-04-03 MED ORDER — VITAMIN D (ERGOCALCIFEROL) 1.25 MG (50000 UNIT) PO CAPS: 50000.0000 [IU] | ORAL_CAPSULE | ORAL | 0 refills | Status: AC

## 2022-04-04 ENCOUNTER — Telehealth: Payer: Self-pay | Admitting: *Deleted

## 2022-04-04 NOTE — Telephone Encounter (Signed)
Wife is calling for name of a stimulator for foot and status. Called to explain that it is a bone stimulator and someone will be calling with more information on it. She verbalized understanding.

## 2022-04-17 DIAGNOSIS — N390 Urinary tract infection, site not specified: Secondary | ICD-10-CM | POA: Diagnosis not present

## 2022-05-01 ENCOUNTER — Ambulatory Visit (INDEPENDENT_AMBULATORY_CARE_PROVIDER_SITE_OTHER): Payer: Medicare Other

## 2022-05-01 ENCOUNTER — Encounter: Payer: Self-pay | Admitting: Podiatry

## 2022-05-01 ENCOUNTER — Ambulatory Visit: Payer: Medicare Other | Admitting: Podiatry

## 2022-05-01 DIAGNOSIS — Z9889 Other specified postprocedural states: Secondary | ICD-10-CM

## 2022-05-01 DIAGNOSIS — M960 Pseudarthrosis after fusion or arthrodesis: Secondary | ICD-10-CM | POA: Diagnosis not present

## 2022-05-01 DIAGNOSIS — M2012 Hallux valgus (acquired), left foot: Secondary | ICD-10-CM

## 2022-05-01 NOTE — Progress Notes (Signed)
He presents today date of surgery 12/08/2028 Lapidus bunionectomy left foot with screws and staples at this point he states that he just received his bone stimulator has been taking his vitamin D weekly.  He states that the foot still aches if he is on it.  Objective: Vital signs stable alert and oriented x3 essentially unchanged from last visit.  Radiographs also the same he still has an elevated first metatarsal from distraction of the plantar aspect of the osteotomy and fusion site.  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  Plan: Currently we are trying bone stimulator and vitamin D I will follow-up with him in 1 month if worsened and he understands surgery is going to be necessary.

## 2022-05-21 ENCOUNTER — Other Ambulatory Visit: Payer: Self-pay | Admitting: Physician Assistant

## 2022-05-21 DIAGNOSIS — L299 Pruritus, unspecified: Secondary | ICD-10-CM

## 2022-06-07 ENCOUNTER — Ambulatory Visit (INDEPENDENT_AMBULATORY_CARE_PROVIDER_SITE_OTHER): Payer: Medicare Other

## 2022-06-07 ENCOUNTER — Ambulatory Visit: Payer: Medicare Other | Admitting: Podiatry

## 2022-06-07 ENCOUNTER — Encounter: Payer: Self-pay | Admitting: Podiatry

## 2022-06-07 DIAGNOSIS — Z9889 Other specified postprocedural states: Secondary | ICD-10-CM | POA: Diagnosis not present

## 2022-06-07 DIAGNOSIS — M2012 Hallux valgus (acquired), left foot: Secondary | ICD-10-CM

## 2022-06-07 NOTE — Progress Notes (Signed)
He presents today for follow-up of his nonunion attempted arthrodesis Lapidus prepare date of surgery 12/08/2021 left foot.  He states that he seems to be doing much better he contributes it to drinking a lot of bone broth and taking calcium.  He also continues to use his bone stimulator twice daily.  Objective: Vital signs are stable he is alert and oriented x3 left foot demonstrates much decrease in edema no erythema cellulitis drainage or odor still has some limitation on dorsiflexion but no pain on attempted range of motion at the arthrodesis site.  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  Plan: Continue the use of the bone stimulator also discussed taking 5000 IUs of D3 daily and taking extra calcium.  Follow-up with him in 4 to 6 weeks for another x-ray.

## 2022-06-19 ENCOUNTER — Ambulatory Visit: Payer: PPO | Admitting: Physician Assistant

## 2022-07-19 ENCOUNTER — Ambulatory Visit (INDEPENDENT_AMBULATORY_CARE_PROVIDER_SITE_OTHER): Payer: Medicare Other

## 2022-07-19 ENCOUNTER — Encounter: Payer: Self-pay | Admitting: Podiatry

## 2022-07-19 ENCOUNTER — Ambulatory Visit: Payer: Medicare Other | Admitting: Podiatry

## 2022-07-19 DIAGNOSIS — M2012 Hallux valgus (acquired), left foot: Secondary | ICD-10-CM | POA: Diagnosis not present

## 2022-07-19 DIAGNOSIS — Z9889 Other specified postprocedural states: Secondary | ICD-10-CM

## 2022-07-19 NOTE — Progress Notes (Signed)
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  Objective: Vital signs are stable he is alert and oriented x3.  Pulses are palpable.  He has no pain or warmth on range of motion of the first TMT joint.  He has great range of motion of the first metatarsophalangeal joint that the bone is somewhat elevated on radiographs.  Radiographs also confirmed today that there is no change from previous radiographs a month ago.  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  Plan: I will allow him to get back to his regular activity should this become more painful in the future he will notify me for a

## 2022-08-11 ENCOUNTER — Other Ambulatory Visit: Payer: Self-pay

## 2022-08-11 ENCOUNTER — Encounter (HOSPITAL_BASED_OUTPATIENT_CLINIC_OR_DEPARTMENT_OTHER): Payer: Self-pay

## 2022-08-11 ENCOUNTER — Emergency Department (HOSPITAL_BASED_OUTPATIENT_CLINIC_OR_DEPARTMENT_OTHER)
Admission: EM | Admit: 2022-08-11 | Discharge: 2022-08-11 | Disposition: A | Discharge status: Home / Self Care | Payer: Medicare Other | Attending: Emergency Medicine | Admitting: Emergency Medicine

## 2022-08-11 DIAGNOSIS — T161XXA Foreign body in right ear, initial encounter: Secondary | ICD-10-CM | POA: Insufficient documentation

## 2022-08-11 DIAGNOSIS — Z7982 Long term (current) use of aspirin: Secondary | ICD-10-CM | POA: Diagnosis not present

## 2022-08-11 DIAGNOSIS — X58XXXA Exposure to other specified factors, initial encounter: Secondary | ICD-10-CM | POA: Diagnosis not present

## 2022-08-11 NOTE — Discharge Instructions (Signed)
You were seen in the emergency department for foreign body in your right ear canal.  Foreign body was removed and there was no evidence of perforation or bleeding.  Return to the emergency department if any worsening or concerning symptoms

## 2022-08-11 NOTE — ED Triage Notes (Signed)
Patient has plastic piece from hearing aid stuck in right ear.

## 2022-08-11 NOTE — ED Notes (Signed)
Small piece of clear plastic piece from right ear with small needle nose tweezers.

## 2022-08-11 NOTE — ED Provider Notes (Signed)
[facility] DEPT Provider Note   CSN: [id number] Arrival date & time: 08/11/22  1511     History {Add pertinent medical, surgical, social history, OB history to HPI:1} Chief Complaint  Patient presents with   Foreign Body in [place] is a 81 y.o. male.  He is here with a complaint of a piece of his hearing aid in his right ear canal.  No other injuries or complaints.  No discharge or fevers.  The history is provided by the patient.  Foreign Body in Ear This is a new problem. The problem occurs constantly. The problem has not changed since onset.Pertinent negatives include no chest pain, no abdominal pain, no headaches and no shortness of breath. Nothing aggravates the symptoms. Nothing relieves the symptoms. Treatments tried: Removal. The treatment provided no relief.       Home Medications Prior to Admission medications   Medication Sig Start Date End Date Taking? Authorizing Provider  acetaminophen (TYLENOL) 500 MG tablet Take 500 mg by mouth every 6 (six) hours as needed for moderate pain.    [provider]  aspirin EC 81 MG tablet Take 81 mg by mouth at bedtime.    [provider]  Carboxymethylcellul-Glycerin (LUBRICATING EYE DROPS OP) Place 1 drop into both eyes daily as needed (dry eyes).    [provider]  chlorthalidone (HYGROTON) 25 MG tablet Take 25 mg by mouth daily. 11/18/19   [provider]  Cholecalciferol (VITAMIN D3) 50 MCG (2000 UT) capsule Take 2,000 Units by mouth daily.    [provider]  irbesartan (AVAPRO) 150 MG tablet Take 150 mg by mouth daily. 10/18/19   [provider]  omeprazole (PRILOSEC) 20 MG capsule Take 20 mg by mouth 2 (two) times daily.    [provider]  ondansetron (ZOFRAN) 4 MG tablet Take 1 tablet (4 mg total) by mouth every 8 (eight) hours as needed. 12/06/21   Hyatt, Max T, DPM  traMADol (ULTRAM) 50 MG tablet  09/29/21   [provider]  TURMERIC-GINGER PO Take 1 tablet by mouth daily.    [provider]  Vitamin D, Ergocalciferol, (DRISDOL) 1.25 MG (50000 UNIT) CAPS capsule Take 1 capsule (50,000 Units total) by mouth every 7 (seven) days. 04/03/22   Hyatt, Max T, DPM      Allergies    Patient has no known allergies.    Review of Systems   Review of Systems  Respiratory:  Negative for shortness of breath.   Cardiovascular:  Negative for chest pain.  Gastrointestinal:  Negative for abdominal pain.  Neurological:  Negative for headaches.    Physical Exam Updated Vital Signs BP 139/77 (BP Location: Right Arm)   Pulse 77   Temp 98 F (36.7 C)   Resp 16   Ht 5' 5.5" (1.664 m)   Wt 74.8 kg   SpO2 99%   BMI 27.04 kg/m  Physical Exam Vitals and nursing note reviewed.  Constitutional:      Appearance: Normal appearance. He is well-developed.  HENT:     Head: Normocephalic and atraumatic.     Right Ear: Tympanic membrane, ear canal and external ear normal.     Left Ear: Tympanic membrane, ear canal and external ear normal.     Nose: Nose normal.  Eyes:     Conjunctiva/sclera: Conjunctivae normal.  Pulmonary:     Effort: Pulmonary effort is normal.  Musculoskeletal:     Cervical back: Neck supple.  Skin:    General: Skin is warm and dry.  Neurological:     Mental Status: He is alert.     GCS: GCS eye subscore is 4. GCS verbal subscore is 5. GCS motor subscore is 6.     ED Results / Procedures / Treatments   Labs (all labs ordered are listed, but only abnormal results are displayed) Labs Reviewed - No data to display  EKG None  Radiology No results found.  Procedures Procedures  {Document cardiac monitor, telemetry assessment procedure when appropriate:1}  Medications Ordered in ED Medications - No data to display  ED Course/ Medical Decision Making/ A&P                           Medical Decision Making  Foreign body was removed by triage nurse under direct  visualization.  On my exam there is no evidence of perforation or bleeding.  {Document critical care time when appropriate:1} {Document review of labs and clinical decision tools ie heart score, Chads2Vasc2 etc:1}  {Document your independent review of radiology images, and any outside records:1} {Document your discussion with family members, caretakers, and with consultants:1} {Document social determinants of health affecting pt's care:1} {Document your decision making why or why not admission, treatments were needed:1} Final Clinical Impression(s) / ED Diagnoses Final diagnoses:  Foreign body of right ear, initial encounter    Rx / DC Orders ED Discharge Orders     None

## 2022-09-18 DIAGNOSIS — Z125 Encounter for screening for malignant neoplasm of prostate: Secondary | ICD-10-CM | POA: Diagnosis not present

## 2022-09-18 DIAGNOSIS — E785 Hyperlipidemia, unspecified: Secondary | ICD-10-CM | POA: Diagnosis not present

## 2022-09-18 DIAGNOSIS — I10 Essential (primary) hypertension: Secondary | ICD-10-CM | POA: Diagnosis not present

## 2022-09-25 DIAGNOSIS — N1831 Chronic kidney disease, stage 3a: Secondary | ICD-10-CM | POA: Diagnosis not present

## 2022-09-25 DIAGNOSIS — R82998 Other abnormal findings in urine: Secondary | ICD-10-CM | POA: Diagnosis not present

## 2022-09-25 DIAGNOSIS — I129 Hypertensive chronic kidney disease with stage 1 through stage 4 chronic kidney disease, or unspecified chronic kidney disease: Secondary | ICD-10-CM | POA: Diagnosis not present

## 2022-09-25 DIAGNOSIS — Z0000 Encounter for general adult medical examination without abnormal findings: Secondary | ICD-10-CM | POA: Diagnosis not present

## 2022-09-25 DIAGNOSIS — K219 Gastro-esophageal reflux disease without esophagitis: Secondary | ICD-10-CM | POA: Diagnosis not present

## 2022-09-25 DIAGNOSIS — Z23 Encounter for immunization: Secondary | ICD-10-CM | POA: Diagnosis not present

## 2022-10-26 DIAGNOSIS — H04123 Dry eye syndrome of bilateral lacrimal glands: Secondary | ICD-10-CM | POA: Diagnosis not present

## 2022-10-26 DIAGNOSIS — H524 Presbyopia: Secondary | ICD-10-CM | POA: Diagnosis not present

## 2023-01-29 DIAGNOSIS — L570 Actinic keratosis: Secondary | ICD-10-CM | POA: Diagnosis not present

## 2023-01-29 DIAGNOSIS — Z7189 Other specified counseling: Secondary | ICD-10-CM | POA: Diagnosis not present

## 2023-01-29 DIAGNOSIS — I8393 Asymptomatic varicose veins of bilateral lower extremities: Secondary | ICD-10-CM | POA: Diagnosis not present

## 2023-01-29 DIAGNOSIS — D225 Melanocytic nevi of trunk: Secondary | ICD-10-CM | POA: Diagnosis not present

## 2023-01-29 DIAGNOSIS — L821 Other seborrheic keratosis: Secondary | ICD-10-CM | POA: Diagnosis not present

## 2023-01-29 DIAGNOSIS — L718 Other rosacea: Secondary | ICD-10-CM | POA: Diagnosis not present

## 2023-01-29 DIAGNOSIS — L814 Other melanin hyperpigmentation: Secondary | ICD-10-CM | POA: Diagnosis not present

## 2023-03-18 DIAGNOSIS — Z981 Arthrodesis status: Secondary | ICD-10-CM | POA: Diagnosis not present

## 2023-03-18 DIAGNOSIS — R202 Paresthesia of skin: Secondary | ICD-10-CM | POA: Diagnosis not present

## 2023-03-18 DIAGNOSIS — M5416 Radiculopathy, lumbar region: Secondary | ICD-10-CM | POA: Diagnosis not present

## 2023-04-01 ENCOUNTER — Other Ambulatory Visit: Payer: Self-pay | Admitting: Internal Medicine

## 2023-04-01 ENCOUNTER — Encounter: Payer: Self-pay | Admitting: Internal Medicine

## 2023-04-01 DIAGNOSIS — M5416 Radiculopathy, lumbar region: Secondary | ICD-10-CM

## 2023-04-04 ENCOUNTER — Ambulatory Visit
Admission: RE | Admit: 2023-04-04 | Discharge: 2023-04-04 | Disposition: A | Discharge status: Home / Self Care | Payer: Medicare HMO | Source: Ambulatory Visit | Attending: Internal Medicine | Admitting: Internal Medicine

## 2023-04-04 DIAGNOSIS — M5416 Radiculopathy, lumbar region: Secondary | ICD-10-CM

## 2023-04-04 DIAGNOSIS — M4807 Spinal stenosis, lumbosacral region: Secondary | ICD-10-CM | POA: Diagnosis not present

## 2023-04-04 DIAGNOSIS — Z981 Arthrodesis status: Secondary | ICD-10-CM | POA: Diagnosis not present

## 2023-04-24 DIAGNOSIS — Z6827 Body mass index (BMI) 27.0-27.9, adult: Secondary | ICD-10-CM | POA: Diagnosis not present

## 2023-04-24 DIAGNOSIS — M5416 Radiculopathy, lumbar region: Secondary | ICD-10-CM | POA: Diagnosis not present

## 2023-04-25 DIAGNOSIS — M5417 Radiculopathy, lumbosacral region: Secondary | ICD-10-CM | POA: Diagnosis not present

## 2023-04-25 DIAGNOSIS — M5116 Intervertebral disc disorders with radiculopathy, lumbar region: Secondary | ICD-10-CM | POA: Diagnosis not present

## 2023-10-16 DIAGNOSIS — E785 Hyperlipidemia, unspecified: Secondary | ICD-10-CM | POA: Diagnosis not present

## 2023-10-16 DIAGNOSIS — Z125 Encounter for screening for malignant neoplasm of prostate: Secondary | ICD-10-CM | POA: Diagnosis not present

## 2023-10-23 DIAGNOSIS — I129 Hypertensive chronic kidney disease with stage 1 through stage 4 chronic kidney disease, or unspecified chronic kidney disease: Secondary | ICD-10-CM | POA: Diagnosis not present

## 2023-10-23 DIAGNOSIS — R82998 Other abnormal findings in urine: Secondary | ICD-10-CM | POA: Diagnosis not present

## 2023-10-23 DIAGNOSIS — Z1331 Encounter for screening for depression: Secondary | ICD-10-CM | POA: Diagnosis not present

## 2023-10-23 DIAGNOSIS — Z0000 Encounter for general adult medical examination without abnormal findings: Secondary | ICD-10-CM | POA: Diagnosis not present

## 2023-10-23 DIAGNOSIS — Z1339 Encounter for screening examination for other mental health and behavioral disorders: Secondary | ICD-10-CM | POA: Diagnosis not present

## 2023-10-23 DIAGNOSIS — Z23 Encounter for immunization: Secondary | ICD-10-CM | POA: Diagnosis not present

## 2023-11-18 DIAGNOSIS — M1811 Unilateral primary osteoarthritis of first carpometacarpal joint, right hand: Secondary | ICD-10-CM | POA: Diagnosis not present

## 2023-11-18 DIAGNOSIS — H524 Presbyopia: Secondary | ICD-10-CM | POA: Diagnosis not present

## 2023-11-26 DIAGNOSIS — K080 Exfoliation of teeth due to systemic causes: Secondary | ICD-10-CM | POA: Diagnosis not present

## 2023-12-16 DIAGNOSIS — M1811 Unilateral primary osteoarthritis of first carpometacarpal joint, right hand: Secondary | ICD-10-CM | POA: Diagnosis not present

## 2024-01-07 HISTORY — PX: HAND SURGERY: SHX662

## 2024-01-14 DIAGNOSIS — M1811 Unilateral primary osteoarthritis of first carpometacarpal joint, right hand: Secondary | ICD-10-CM | POA: Diagnosis not present

## 2024-01-14 DIAGNOSIS — M19031 Primary osteoarthritis, right wrist: Secondary | ICD-10-CM | POA: Diagnosis not present

## 2024-01-14 DIAGNOSIS — G8918 Other acute postprocedural pain: Secondary | ICD-10-CM | POA: Diagnosis not present

## 2024-01-14 DIAGNOSIS — M13831 Other specified arthritis, right wrist: Secondary | ICD-10-CM | POA: Diagnosis not present

## 2024-01-22 DIAGNOSIS — Z4789 Encounter for other orthopedic aftercare: Secondary | ICD-10-CM | POA: Diagnosis not present

## 2024-01-22 DIAGNOSIS — M65841 Other synovitis and tenosynovitis, right hand: Secondary | ICD-10-CM | POA: Diagnosis not present

## 2024-01-27 DIAGNOSIS — M65841 Other synovitis and tenosynovitis, right hand: Secondary | ICD-10-CM | POA: Diagnosis not present

## 2024-01-27 DIAGNOSIS — Z4789 Encounter for other orthopedic aftercare: Secondary | ICD-10-CM | POA: Diagnosis not present

## 2024-01-30 DIAGNOSIS — L814 Other melanin hyperpigmentation: Secondary | ICD-10-CM | POA: Diagnosis not present

## 2024-01-30 DIAGNOSIS — L821 Other seborrheic keratosis: Secondary | ICD-10-CM | POA: Diagnosis not present

## 2024-01-30 DIAGNOSIS — L2989 Other pruritus: Secondary | ICD-10-CM | POA: Diagnosis not present

## 2024-01-30 DIAGNOSIS — D2372 Other benign neoplasm of skin of left lower limb, including hip: Secondary | ICD-10-CM | POA: Diagnosis not present

## 2024-02-12 DIAGNOSIS — M25541 Pain in joints of right hand: Secondary | ICD-10-CM | POA: Diagnosis not present

## 2024-02-12 DIAGNOSIS — M65841 Other synovitis and tenosynovitis, right hand: Secondary | ICD-10-CM | POA: Diagnosis not present

## 2024-02-21 DIAGNOSIS — M25541 Pain in joints of right hand: Secondary | ICD-10-CM | POA: Diagnosis not present

## 2024-02-28 DIAGNOSIS — M25541 Pain in joints of right hand: Secondary | ICD-10-CM | POA: Diagnosis not present

## 2024-03-05 DIAGNOSIS — M25541 Pain in joints of right hand: Secondary | ICD-10-CM | POA: Diagnosis not present

## 2024-03-11 DIAGNOSIS — M25541 Pain in joints of right hand: Secondary | ICD-10-CM | POA: Diagnosis not present

## 2024-03-11 DIAGNOSIS — Z4789 Encounter for other orthopedic aftercare: Secondary | ICD-10-CM | POA: Diagnosis not present

## 2024-03-20 DIAGNOSIS — M25541 Pain in joints of right hand: Secondary | ICD-10-CM | POA: Diagnosis not present

## 2024-03-27 DIAGNOSIS — M25541 Pain in joints of right hand: Secondary | ICD-10-CM | POA: Diagnosis not present

## 2024-04-03 DIAGNOSIS — M25541 Pain in joints of right hand: Secondary | ICD-10-CM | POA: Diagnosis not present

## 2024-04-13 DIAGNOSIS — Z4789 Encounter for other orthopedic aftercare: Secondary | ICD-10-CM | POA: Diagnosis not present

## 2024-04-13 DIAGNOSIS — M25541 Pain in joints of right hand: Secondary | ICD-10-CM | POA: Diagnosis not present

## 2024-04-15 DIAGNOSIS — I129 Hypertensive chronic kidney disease with stage 1 through stage 4 chronic kidney disease, or unspecified chronic kidney disease: Secondary | ICD-10-CM | POA: Diagnosis not present

## 2024-04-28 DIAGNOSIS — I129 Hypertensive chronic kidney disease with stage 1 through stage 4 chronic kidney disease, or unspecified chronic kidney disease: Secondary | ICD-10-CM | POA: Diagnosis not present

## 2024-05-05 DIAGNOSIS — K080 Exfoliation of teeth due to systemic causes: Secondary | ICD-10-CM | POA: Diagnosis not present

## 2024-05-26 DIAGNOSIS — K080 Exfoliation of teeth due to systemic causes: Secondary | ICD-10-CM | POA: Diagnosis not present

## 2024-06-18 DIAGNOSIS — R300 Dysuria: Secondary | ICD-10-CM | POA: Diagnosis not present

## 2024-06-18 DIAGNOSIS — N390 Urinary tract infection, site not specified: Secondary | ICD-10-CM | POA: Diagnosis not present

## 2024-07-06 ENCOUNTER — Other Ambulatory Visit: Payer: Self-pay | Admitting: Family Medicine

## 2024-07-06 DIAGNOSIS — K4090 Unilateral inguinal hernia, without obstruction or gangrene, not specified as recurrent: Secondary | ICD-10-CM

## 2024-07-10 ENCOUNTER — Ambulatory Visit
Admission: RE | Admit: 2024-07-10 | Discharge: 2024-07-10 | Disposition: A | Discharge status: Home / Self Care | Source: Ambulatory Visit | Attending: Family Medicine | Admitting: Family Medicine

## 2024-07-10 DIAGNOSIS — K4090 Unilateral inguinal hernia, without obstruction or gangrene, not specified as recurrent: Secondary | ICD-10-CM

## 2024-07-10 DIAGNOSIS — K449 Diaphragmatic hernia without obstruction or gangrene: Secondary | ICD-10-CM | POA: Diagnosis not present

## 2024-07-10 DIAGNOSIS — R300 Dysuria: Secondary | ICD-10-CM | POA: Diagnosis not present

## 2024-07-10 DIAGNOSIS — M1990 Unspecified osteoarthritis, unspecified site: Secondary | ICD-10-CM | POA: Diagnosis not present

## 2024-07-10 MED ORDER — IOPAMIDOL (ISOVUE-370) INJECTION 76%
80.0000 mL | Freq: Once | INTRAVENOUS | Status: AC | PRN
  Administered 2024-07-10: 80 mL via INTRAVENOUS

## 2024-07-17 ENCOUNTER — Ambulatory Visit: Payer: Self-pay | Admitting: Surgery

## 2024-07-17 DIAGNOSIS — K4090 Unilateral inguinal hernia, without obstruction or gangrene, not specified as recurrent: Secondary | ICD-10-CM | POA: Diagnosis not present

## 2024-07-17 NOTE — H&P (Signed)
 Subjective    Chief Complaint: New Consultation (Lt inguinal hernia)       History of Present Illness: Carl Moody is a 83 y.o. male who is seen today as an office consultation at the request of Dr. Christi for evaluation of New Consultation (Lt inguinal hernia) .     This is a 83 year old male in relatively good health who presents with approximately 1 year history of a small visible bulge in his left groin.  Recently this has become larger.  There is minimal discomfort associated with it.  He remains reducible.  He denies any obstructive symptoms.  He brought this to the attention of the nurse practitioner with his PCP.  She obtained a CT scan which showed a moderate-sized left inguinal hernia with no involvement of the bowel.  Incidentally, he also has a hiatal hernia as well as sigmoid colon diverticulosis.  He presents now to discuss inguinal hernia repair.     Review of Systems: A complete review of systems was obtained from the patient.  I have reviewed this information and discussed as appropriate with the patient.  See HPI as well for other ROS.   Review of Systems  Constitutional: Negative.   HENT: Negative.    Eyes: Negative.   Respiratory: Negative.    Cardiovascular: Negative.   Gastrointestinal: Negative.   Genitourinary: Negative.   Musculoskeletal: Negative.   Skin: Negative.   Neurological: Negative.   Endo/Heme/Allergies: Negative.   Psychiatric/Behavioral: Negative.          Medical History: Past Medical History      Past Medical History:  Diagnosis Date   GERD (gastroesophageal reflux disease)          Problem List     Patient Active Problem List  Diagnosis   Benign neoplasm of stomach   Diverticular disease of colon   PVC's (premature ventricular contractions)   Exertional chest pain        Past Surgical History       Past Surgical History:  Procedure Laterality Date   SPINE SURGERY       THORACIC SPINE SURGERY             Allergies  No Known Allergies     Medications Ordered Prior to Encounter        Current Outpatient Medications on File Prior to Visit  Medication Sig Dispense Refill   chlorthalidone 25 MG tablet Take 25 mg by mouth once daily       irbesartan (AVAPRO) 150 MG tablet Take 150 mg by mouth once daily       cholecalciferol (VITAMIN D3) 2,000 unit capsule Take 2,000 Units by mouth once daily       omeprazole-sodium bicarbonate (ZEGERID) 40-1.1 mg-gram capsule Take 1 capsule by mouth        No current facility-administered medications on file prior to visit.        Family History  History reviewed. No pertinent family history.      Tobacco Use History  Social History        Tobacco Use  Smoking Status Former   Types: Cigarettes  Smokeless Tobacco Never        Social History  Social History         Socioeconomic History   Marital status: Married  Tobacco Use   Smoking status: Former      Types: Cigarettes   Smokeless tobacco: Never  Vaping Use   Vaping status: Unknown  Substance and Sexual Activity  Alcohol  use: Yes      Alcohol /week: 0.0 - 1.0 standard drinks of alcohol    Drug use: Never    Social Drivers of Health          Received from [employer]    Social Network  Housing Stability: Unknown (07/17/2024)    Housing Stability Vital Sign     Homeless in the Last Year: No        Objective:         Vitals:    07/17/24 0952  BP: 112/72  Pulse: 79  Resp: 16  Temp: 36.6 C (97.8 F)  SpO2: 98%  Weight: 74.3 kg (163 lb 12.8 oz)  Height: 166.4 cm (5' 5.5)  PainSc: 0-No pain  PainLoc: Groin    Body mass index is 26.84 kg/m.   Physical Exam    Constitutional:  WDWN in NAD, conversant, no obvious deformities; lying in bed comfortably Eyes:  Pupils equal, round; sclera anicteric; moist conjunctiva; no lid lag HENT:  Oral mucosa moist; good dentition  Neck:  No masses palpated, trachea midline; no thyromegaly Lungs:  CTA bilaterally; normal  respiratory effort CV:  Regular rate and rhythm; no murmurs; extremities well-perfused with no edema Abd:  +bowel sounds, soft, non-tender, no palpable organomegaly; no palpable hernias GU: Bilateral descended testes, no testicular masses, visible reducible left inguinal hernia, no sign of right inguinal hernia Musc:  Normal gait; no apparent clubbing or cyanosis in extremities Lymphatic:  No palpable cervical or axillary lymphadenopathy Skin:  Warm, dry; no sign of jaundice Psychiatric - alert and oriented x 4; calm mood and affect     Labs, Imaging and Diagnostic Testing: CLINICAL DATA:  Enlarging left inguinal hernia.   EXAM: CT ABDOMEN AND PELVIS WITH CONTRAST   TECHNIQUE: Multidetector CT imaging of the abdomen and pelvis was performed using the standard protocol following bolus administration of intravenous contrast.   RADIATION DOSE REDUCTION: This exam was performed according to the departmental dose-optimization program which includes automated exposure control, adjustment of the mA and/or kV according to patient size and/or use of iterative reconstruction technique.   CONTRAST:  80mL ISOVUE-370 IOPAMIDOL (ISOVUE-370) INJECTION 76%   COMPARISON:  None Available.   FINDINGS: Lower Chest: No acute findings.   Hepatobiliary: 1.7 cm benign hemangioma noted in the liver dome. No suspicious hepatic masses identified. Gallbladder is unremarkable. No evidence of biliary ductal dilatation.   Pancreas:  No mass or inflammatory changes.   Spleen: Within normal limits in size and appearance.   Adrenals/Urinary Tract: No suspicious masses identified. No evidence of ureteral calculi or hydronephrosis. 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   Stomach/Bowel: Moderate to large hiatal hernia is seen. No evidence of obstruction, inflammatory process or abnormal fluid  collections. Sigmoid diverticulosis noted, without signs of diverticulitis.   Vascular/Lymphatic: No pathologically enlarged lymph nodes. No acute vascular findings.   Reproductive:  Moderately enlarged prostate gland noted.   Other:  Small left inguinal hernia, which contains only fat.   Musculoskeletal: No suspicious bone lesions identified. Lumbar spine fusion hardware at L4-5.   IMPRESSION: Small left inguinal hernia, which contains only fat.   Moderately enlarged prostate, and findings of chronic bladder outlet obstruction.   Moderate to large hiatal hernia.   Sigmoid diverticulosis, without radiographic evidence of diverticulitis.     Electronically Signed   By: Norleen DELENA Kil M.D.   On: 07/13/2024 14:48     Assessment  and Plan:  Diagnoses and all orders for this visit:   Left inguinal hernia       Recommend left inguinal hernia repair with mesh.The surgical procedure has been discussed with the patient.  Potential risks, benefits, alternative treatments, and expected outcomes have been explained.  All of the patient's questions at this time have been answered.  The likelihood of reaching the patient's treatment goal is good.  The patient understands the proposed surgical procedure and wishes to proceed.     Fatema Rabe DEWAYNE LIMA, MD  07/17/2024 10:26 AM

## 2024-07-17 NOTE — Progress Notes (Signed)
 REFERRING PHYSICIAN:  Drinkard, Beverley Louder  PROVIDER:  DONNICE DEWAYNE LIMA, MD  MRN: [id number] DOB: [birth year] DATE OF ENCOUNTER: 07/17/2024  Subjective   Chief Complaint: New Consultation (Lt inguinal hernia)     History of Present Illness: Carl Moody is a 83 y.o. male who is seen today as an office consultation at the request of Dr. Christi for evaluation of New Consultation (Lt inguinal hernia) .    This is a 83 year old male in relatively good health who presents with approximately 1 year history of a small visible bulge in his left groin.  Recently this has become larger.  There is minimal discomfort associated with it.  He remains reducible.  He denies any obstructive symptoms.  He brought this to the attention of the nurse practitioner with his PCP.  She obtained a CT scan which showed a moderate-sized left inguinal hernia with no involvement of the bowel.  Incidentally, he also has a hiatal hernia as well as sigmoid colon diverticulosis.  He presents now to discuss inguinal hernia repair.   Review of Systems: A complete review of systems was obtained from the patient.  I have reviewed this information and discussed as appropriate with the patient.  See HPI as well for other ROS.  Review of Systems  Constitutional: Negative.   HENT: Negative.    Eyes: Negative.   Respiratory: Negative.    Cardiovascular: Negative.   Gastrointestinal: Negative.   Genitourinary: Negative.   Musculoskeletal: Negative.   Skin: Negative.   Neurological: Negative.   Endo/Heme/Allergies: Negative.   Psychiatric/Behavioral: Negative.        Medical History: Past Medical History:  Diagnosis Date  . GERD (gastroesophageal reflux disease)     Patient Active Problem List  Diagnosis  . Benign neoplasm of stomach  . Diverticular disease of colon  . PVC's (premature ventricular contractions)  . Exertional chest pain    Past Surgical History:  Procedure Laterality Date  . SPINE  SURGERY    . THORACIC SPINE SURGERY       No Known Allergies  Current Outpatient Medications on File Prior to Visit  Medication Sig Dispense Refill  . chlorthalidone 25 MG tablet Take 25 mg by mouth once daily    . irbesartan (AVAPRO) 150 MG tablet Take 150 mg by mouth once daily    . cholecalciferol (VITAMIN D3) 2,000 unit capsule Take 2,000 Units by mouth once daily    . omeprazole-sodium bicarbonate (ZEGERID) 40-1.1 mg-gram capsule Take 1 capsule by mouth     No current facility-administered medications on file prior to visit.    History reviewed. No pertinent family history.   Social History   Tobacco Use  Smoking Status Former  . Types: Cigarettes  Smokeless Tobacco Never     Social History   Socioeconomic History  . Marital status: Married  Tobacco Use  . Smoking status: Former    Types: Cigarettes  . Smokeless tobacco: Never  Vaping Use  . Vaping status: Unknown  Substance and Sexual Activity  . Alcohol  use: Yes    Alcohol /week: 0.0 - 1.0 standard drinks of alcohol   . Drug use: Never   Social Drivers of Health    Received from [employer]   Social Network  Housing Stability: Unknown (07/17/2024)   Housing Stability Vital Sign   . Homeless in the Last Year: No    Objective:    Vitals:   07/17/24 0952  BP: 112/72  Pulse: 79  Resp: 16  Temp: 36.6  C (97.8 F)  SpO2: 98%  Weight: 74.3 kg (163 lb 12.8 oz)  Height: 166.4 cm (5' 5.5)  PainSc: 0-No pain  PainLoc: Groin    Body mass index is 26.84 kg/m.  Physical Exam   Constitutional:  WDWN in NAD, conversant, no obvious deformities; lying in bed comfortably Eyes:  Pupils equal, round; sclera anicteric; moist conjunctiva; no lid lag HENT:  Oral mucosa moist; good dentition  Neck:  No masses palpated, trachea midline; no thyromegaly Lungs:  CTA bilaterally; normal respiratory effort CV:  Regular rate and rhythm; no murmurs; extremities well-perfused with no edema Abd:  +bowel sounds, soft,  non-tender, no palpable organomegaly; no palpable hernias GU: Bilateral descended testes, no testicular masses, visible reducible left inguinal hernia, no sign of right inguinal hernia Musc:  Normal gait; no apparent clubbing or cyanosis in extremities Lymphatic:  No palpable cervical or axillary lymphadenopathy Skin:  Warm, dry; no sign of jaundice Psychiatric - alert and oriented x 4; calm mood and affect   Labs, Imaging and Diagnostic Testing: CLINICAL DATA:  Enlarging left inguinal hernia.   EXAM: CT ABDOMEN AND PELVIS WITH CONTRAST   TECHNIQUE: Multidetector CT imaging of the abdomen and pelvis was performed using the standard protocol following bolus administration of intravenous contrast.   RADIATION DOSE REDUCTION: This exam was performed according to the departmental dose-optimization program which includes automated exposure control, adjustment of the mA and/or kV according to patient size and/or use of iterative reconstruction technique.   CONTRAST:  80mL ISOVUE-370 IOPAMIDOL (ISOVUE-370) INJECTION 76%   COMPARISON:  None Available.   FINDINGS: Lower Chest: No acute findings.   Hepatobiliary: 1.7 cm benign hemangioma noted in the liver dome. No suspicious hepatic masses identified. Gallbladder is unremarkable. No evidence of biliary ductal dilatation.   Pancreas:  No mass or inflammatory changes.   Spleen: Within normal limits in size and appearance.   Adrenals/Urinary Tract: No suspicious masses identified. No evidence of ureteral calculi or hydronephrosis. 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   Stomach/Bowel: Moderate to large hiatal hernia is seen. No evidence of obstruction, inflammatory process or abnormal fluid collections. Sigmoid diverticulosis noted, without signs of diverticulitis.   Vascular/Lymphatic: No pathologically enlarged  lymph nodes. No acute vascular findings.   Reproductive:  Moderately enlarged prostate gland noted.   Other:  Small left inguinal hernia, which contains only fat.   Musculoskeletal: No suspicious bone lesions identified. Lumbar spine fusion hardware at L4-5.   IMPRESSION: Small left inguinal hernia, which contains only fat.   Moderately enlarged prostate, and findings of chronic bladder outlet obstruction.   Moderate to large hiatal hernia.   Sigmoid diverticulosis, without radiographic evidence of diverticulitis.     Electronically Signed   By: Norleen DELENA Kil M.D.   On: 07/13/2024 14:48    Assessment and Plan:  Diagnoses and all orders for this visit:  Left inguinal hernia     Recommend left inguinal hernia repair with mesh.The surgical procedure has been discussed with the patient.  Potential risks, benefits, alternative treatments, and expected outcomes have been explained.  All of the patient's questions at this time have been answered.  The likelihood of reaching the patient's treatment goal is good.  The patient understands the proposed surgical procedure and wishes to proceed.   MATTHEW DEWAYNE LIMA, MD  07/17/2024 10:26 AM

## 2024-08-20 ENCOUNTER — Encounter (HOSPITAL_BASED_OUTPATIENT_CLINIC_OR_DEPARTMENT_OTHER): Payer: Self-pay | Admitting: Surgery

## 2024-08-20 ENCOUNTER — Other Ambulatory Visit: Payer: Self-pay

## 2024-08-21 ENCOUNTER — Encounter (HOSPITAL_BASED_OUTPATIENT_CLINIC_OR_DEPARTMENT_OTHER)
Admission: RE | Admit: 2024-08-21 | Discharge: 2024-08-21 | Disposition: A | Discharge status: Home / Self Care | Source: Ambulatory Visit | Attending: Surgery | Admitting: Surgery

## 2024-08-21 DIAGNOSIS — Z01818 Encounter for other preprocedural examination: Secondary | ICD-10-CM | POA: Insufficient documentation

## 2024-08-21 LAB — BASIC METABOLIC PANEL WITH GFR
Anion gap: 9 (ref 5–15)
BUN: 27 mg/dL — ABNORMAL HIGH (ref 8–23)
CO2: 25 mmol/L (ref 22–32)
Calcium: 8.9 mg/dL (ref 8.9–10.3)
Chloride: 104 mmol/L (ref 98–111)
Creatinine, Ser: 1.29 mg/dL — ABNORMAL HIGH (ref 0.61–1.24)
GFR, Estimated: 55 mL/min — ABNORMAL LOW (ref >60)
Glucose, Bld: 85 mg/dL (ref 70–99)
Potassium: 4.3 mmol/L (ref 3.5–5.1)
Sodium: 138 mmol/L (ref 135–145)

## 2024-08-21 MED ORDER — CHLORHEXIDINE GLUCONATE CLOTH 2 % EX PADS: 6.0000 | MEDICATED_PAD | Freq: Once | CUTANEOUS | Status: DC

## 2024-08-21 NOTE — Progress Notes (Signed)

## 2024-08-26 NOTE — H&P (Signed)
 Subjective    Chief Complaint: New Consultation (Lt inguinal hernia)       History of Present Illness: Carl Moody is a 83 y.o. male who is seen today as an office consultation at the request of Dr. Christi for evaluation of New Consultation (Lt inguinal hernia) .     This is a 83 year old male in relatively good health who presents with approximately 1 year history of a small visible bulge in his left groin.  Recently this has become larger.  There is minimal discomfort associated with it.  He remains reducible.  He denies any obstructive symptoms.  He brought this to the attention of the nurse practitioner with his PCP.  She obtained a CT scan which showed a moderate-sized left inguinal hernia with no involvement of the bowel.  Incidentally, he also has a hiatal hernia as well as sigmoid colon diverticulosis.  He presents now to discuss inguinal hernia repair.     Review of Systems: A complete review of systems was obtained from the patient.  I have reviewed this information and discussed as appropriate with the patient.  See HPI as well for other ROS.   Review of Systems  Constitutional: Negative.   HENT: Negative.    Eyes: Negative.   Respiratory: Negative.    Cardiovascular: Negative.   Gastrointestinal: Negative.   Genitourinary: Negative.   Musculoskeletal: Negative.   Skin: Negative.   Neurological: Negative.   Endo/Heme/Allergies: Negative.   Psychiatric/Behavioral: Negative.          Medical History: Past Medical History         Past Medical History:  Diagnosis Date   GERD (gastroesophageal reflux disease)          Problem List       Patient Active Problem List  Diagnosis   Benign neoplasm of stomach   Diverticular disease of colon   PVC's (premature ventricular contractions)   Exertional chest pain        Past Surgical History           Past Surgical History:  Procedure Laterality Date   SPINE SURGERY       THORACIC SPINE SURGERY             Allergies  No Known Allergies      Medications Ordered Prior to Encounter             Current Outpatient Medications on File Prior to Visit  Medication Sig Dispense Refill   chlorthalidone 25 MG tablet Take 25 mg by mouth once daily       irbesartan (AVAPRO) 150 MG tablet Take 150 mg by mouth once daily       cholecalciferol (VITAMIN D3) 2,000 unit capsule Take 2,000 Units by mouth once daily       omeprazole-sodium bicarbonate (ZEGERID) 40-1.1 mg-gram capsule Take 1 capsule by mouth        No current facility-administered medications on file prior to visit.        Family History  History reviewed. No pertinent family history.      Tobacco Use History  Social History           Tobacco Use  Smoking Status Former   Types: Cigarettes  Smokeless Tobacco Never        Social History  Social History             Socioeconomic History   Marital status: Married  Tobacco Use   Smoking status: Former  Types: Cigarettes   Smokeless tobacco: Never  Vaping Use   Vaping status: Unknown  Substance and Sexual Activity   Alcohol  use: Yes      Alcohol /week: 0.0 - 1.0 standard drinks of alcohol    Drug use: Never    Social Drivers of Health             Received from [employer]    Social Network  Housing Stability: Unknown (07/17/2024)    Housing Stability Vital Sign     Homeless in the Last Year: No        Objective:           Vitals:    07/17/24 0952  BP: 112/72  Pulse: 79  Resp: 16  Temp: 36.6 C (97.8 F)  SpO2: 98%  Weight: 74.3 kg (163 lb 12.8 oz)  Height: 166.4 cm (5' 5.5)  PainSc: 0-No pain  PainLoc: Groin    Body mass index is 26.84 kg/m.   Physical Exam    Constitutional:  WDWN in NAD, conversant, no obvious deformities; lying in bed comfortably Eyes:  Pupils equal, round; sclera anicteric; moist conjunctiva; no lid lag HENT:  Oral mucosa moist; good dentition  Neck:  No masses palpated, trachea midline; no thyromegaly Lungs:  CTA  bilaterally; normal respiratory effort CV:  Regular rate and rhythm; no murmurs; extremities well-perfused with no edema Abd:  +bowel sounds, soft, non-tender, no palpable organomegaly; no palpable hernias GU: Bilateral descended testes, no testicular masses, visible reducible left inguinal hernia, no sign of right inguinal hernia Musc:  Normal gait; no apparent clubbing or cyanosis in extremities Lymphatic:  No palpable cervical or axillary lymphadenopathy Skin:  Warm, dry; no sign of jaundice Psychiatric - alert and oriented x 4; calm mood and affect     Labs, Imaging and Diagnostic Testing: CLINICAL DATA:  Enlarging left inguinal hernia.   EXAM: CT ABDOMEN AND PELVIS WITH CONTRAST   TECHNIQUE: Multidetector CT imaging of the abdomen and pelvis was performed using the standard protocol following bolus administration of intravenous contrast.   RADIATION DOSE REDUCTION: This exam was performed according to the departmental dose-optimization program which includes automated exposure control, adjustment of the mA and/or kV according to patient size and/or use of iterative reconstruction technique.   CONTRAST:  80mL ISOVUE -370 IOPAMIDOL  (ISOVUE -370) INJECTION 76%   COMPARISON:  None Available.   FINDINGS: Lower Chest: No acute findings.   Hepatobiliary: 1.7 cm benign hemangioma noted in the liver dome. No suspicious hepatic masses identified. Gallbladder is unremarkable. No evidence of biliary ductal dilatation.   Pancreas:  No mass or inflammatory changes.   Spleen: Within normal limits in size and appearance.   Adrenals/Urinary Tract: No suspicious masses identified. No evidence of ureteral calculi or hydronephrosis. 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   Stomach/Bowel: Moderate to large hiatal hernia is seen. No evidence of obstruction, inflammatory  process or abnormal fluid collections. Sigmoid diverticulosis noted, without signs of diverticulitis.   Vascular/Lymphatic: No pathologically enlarged lymph nodes. No acute vascular findings.   Reproductive:  Moderately enlarged prostate gland noted.   Other:  Small left inguinal hernia, which contains only fat.   Musculoskeletal: No suspicious bone lesions identified. Lumbar spine fusion hardware at L4-5.   IMPRESSION: Small left inguinal hernia, which contains only fat.   Moderately enlarged prostate, and findings of chronic bladder outlet obstruction.   Moderate to large hiatal hernia.   Sigmoid diverticulosis, without  radiographic evidence of diverticulitis.     Electronically Signed   By: Norleen DELENA Kil M.D.   On: 07/13/2024 14:48     Assessment and Plan:  Diagnoses and all orders for this visit:   Left inguinal hernia       Recommend left inguinal hernia repair with mesh.The surgical procedure has been discussed with the patient.  Potential risks, benefits, alternative treatments, and expected outcomes have been explained.  All of the patient's questions at this time have been answered.  The likelihood of reaching the patient's treatment goal is good.  The patient understands the proposed surgical procedure and wishes to proceed.  Donnice POUR. Belinda, MD, [facility]  General Surgery   08/26/2024 1:04 PM

## 2024-08-27 ENCOUNTER — Encounter (HOSPITAL_BASED_OUTPATIENT_CLINIC_OR_DEPARTMENT_OTHER): Payer: Self-pay | Admitting: Surgery

## 2024-08-27 ENCOUNTER — Ambulatory Visit (HOSPITAL_BASED_OUTPATIENT_CLINIC_OR_DEPARTMENT_OTHER): Admitting: Anesthesiology

## 2024-08-27 ENCOUNTER — Encounter (HOSPITAL_BASED_OUTPATIENT_CLINIC_OR_DEPARTMENT_OTHER)
Admission: RE | Disposition: A | Discharge status: Home / Self Care | Payer: Self-pay | Source: Home / Self Care | Attending: Surgery

## 2024-08-27 ENCOUNTER — Ambulatory Visit (HOSPITAL_BASED_OUTPATIENT_CLINIC_OR_DEPARTMENT_OTHER)
Admission: RE | Admit: 2024-08-27 | Discharge: 2024-08-27 | Disposition: A | Discharge status: Home / Self Care | Attending: Surgery | Admitting: Surgery

## 2024-08-27 ENCOUNTER — Other Ambulatory Visit: Payer: Self-pay

## 2024-08-27 DIAGNOSIS — K449 Diaphragmatic hernia without obstruction or gangrene: Secondary | ICD-10-CM | POA: Insufficient documentation

## 2024-08-27 DIAGNOSIS — K4090 Unilateral inguinal hernia, without obstruction or gangrene, not specified as recurrent: Secondary | ICD-10-CM | POA: Diagnosis not present

## 2024-08-27 DIAGNOSIS — N320 Bladder-neck obstruction: Secondary | ICD-10-CM | POA: Diagnosis not present

## 2024-08-27 DIAGNOSIS — N401 Enlarged prostate with lower urinary tract symptoms: Secondary | ICD-10-CM | POA: Diagnosis not present

## 2024-08-27 DIAGNOSIS — K5730 Diverticulosis of large intestine without perforation or abscess without bleeding: Secondary | ICD-10-CM | POA: Insufficient documentation

## 2024-08-27 DIAGNOSIS — I10 Essential (primary) hypertension: Secondary | ICD-10-CM | POA: Diagnosis not present

## 2024-08-27 DIAGNOSIS — K219 Gastro-esophageal reflux disease without esophagitis: Secondary | ICD-10-CM | POA: Diagnosis not present

## 2024-08-27 DIAGNOSIS — Z87891 Personal history of nicotine dependence: Secondary | ICD-10-CM | POA: Diagnosis not present

## 2024-08-27 DIAGNOSIS — G8918 Other acute postprocedural pain: Secondary | ICD-10-CM | POA: Diagnosis not present

## 2024-08-27 HISTORY — PX: INGUINAL HERNIA REPAIR: SHX194

## 2024-08-27 SURGERY — REPAIR, HERNIA, INGUINAL, ADULT
Anesthesia: General | Laterality: Left

## 2024-08-27 MED ORDER — OXYCODONE HCL 5 MG PO TABS: 5.0000 mg | ORAL_TABLET | Freq: Four times a day (QID) | ORAL | 0 refills | Status: AC | PRN

## 2024-08-27 MED ORDER — BUPIVACAINE HCL (PF) 0.25 % IJ SOLN
INTRAMUSCULAR | Status: AC
Start: 2024-08-27 — End: 2024-08-27
  Filled 2024-08-27: qty 30

## 2024-08-27 MED ORDER — ONDANSETRON HCL 4 MG/2ML IJ SOLN
INTRAMUSCULAR | Status: AC
  Filled 2024-08-27: qty 2

## 2024-08-27 MED ORDER — DEXAMETHASONE SOD PHOSPHATE PF 10 MG/ML IJ SOLN
INTRAMUSCULAR | Status: DC | PRN
Start: 2024-08-27 — End: 2024-08-27
  Administered 2024-08-27: 5 mg via INTRAVENOUS

## 2024-08-27 MED ORDER — ACETAMINOPHEN 500 MG PO TABS
1000.0000 mg | ORAL_TABLET | ORAL | Status: AC
  Administered 2024-08-27: 1000 mg via ORAL

## 2024-08-27 MED ORDER — MIDAZOLAM HCL 2 MG/2ML IJ SOLN
INTRAMUSCULAR | Status: AC
  Filled 2024-08-27: qty 2

## 2024-08-27 MED ORDER — PROPOFOL 10 MG/ML IV BOLUS
INTRAVENOUS | Status: DC | PRN
  Administered 2024-08-27: 150 mg via INTRAVENOUS

## 2024-08-27 MED ORDER — ONDANSETRON HCL 4 MG/2ML IJ SOLN
INTRAMUSCULAR | Status: DC | PRN
  Administered 2024-08-27: 4 mg via INTRAVENOUS

## 2024-08-27 MED ORDER — OXYCODONE HCL 5 MG/5ML PO SOLN: 5.0000 mg | Freq: Once | ORAL | Status: AC | PRN

## 2024-08-27 MED ORDER — FENTANYL CITRATE (PF) 100 MCG/2ML IJ SOLN
INTRAMUSCULAR | Status: AC
  Filled 2024-08-27: qty 2

## 2024-08-27 MED ORDER — PHENYLEPHRINE HCL (PRESSORS) 10 MG/ML IV SOLN
INTRAVENOUS | Status: DC | PRN
Start: 2024-08-27 — End: 2024-08-27
  Administered 2024-08-27 (×2): 80 ug via INTRAVENOUS
  Administered 2024-08-27 (×2): 160 ug via INTRAVENOUS
  Administered 2024-08-27 (×3): 80 ug via INTRAVENOUS

## 2024-08-27 MED ORDER — ROPIVACAINE HCL 5 MG/ML IJ SOLN
INTRAMUSCULAR | Status: DC | PRN
  Administered 2024-08-27: 30 mL

## 2024-08-27 MED ORDER — ACETAMINOPHEN 500 MG PO TABS
ORAL_TABLET | ORAL | Status: AC
  Filled 2024-08-27: qty 2

## 2024-08-27 MED ORDER — LACTATED RINGERS IV SOLN: INTRAVENOUS | Status: DC

## 2024-08-27 MED ORDER — FENTANYL CITRATE (PF) 100 MCG/2ML IJ SOLN
50.0000 ug | Freq: Once | INTRAMUSCULAR | Status: AC
  Administered 2024-08-27: 50 ug via INTRAVENOUS

## 2024-08-27 MED ORDER — CEFAZOLIN SODIUM-DEXTROSE 2-4 GM/100ML-% IV SOLN: 2.0000 g | INTRAVENOUS | Status: DC

## 2024-08-27 MED ORDER — LIDOCAINE 2% (20 MG/ML) 5 ML SYRINGE
INTRAMUSCULAR | Status: AC
Start: 2024-08-27 — End: 2024-08-27
  Filled 2024-08-27: qty 5

## 2024-08-27 MED ORDER — PROPOFOL 10 MG/ML IV BOLUS
INTRAVENOUS | Status: AC
  Filled 2024-08-27: qty 20

## 2024-08-27 MED ORDER — CEFAZOLIN SODIUM-DEXTROSE 2-4 GM/100ML-% IV SOLN
INTRAVENOUS | Status: AC
  Filled 2024-08-27: qty 100

## 2024-08-27 MED ORDER — BUPIVACAINE HCL 0.25 % IJ SOLN
INTRAMUSCULAR | Status: DC | PRN
  Administered 2024-08-27: 10 mL

## 2024-08-27 MED ORDER — FENTANYL CITRATE (PF) 100 MCG/2ML IJ SOLN
INTRAMUSCULAR | Status: DC | PRN
  Administered 2024-08-27: 50 ug via INTRAVENOUS

## 2024-08-27 MED ORDER — OXYCODONE HCL 5 MG PO TABS
ORAL_TABLET | ORAL | Status: AC
  Filled 2024-08-27: qty 1

## 2024-08-27 MED ORDER — OXYCODONE HCL 5 MG PO TABS
5.0000 mg | ORAL_TABLET | Freq: Once | ORAL | Status: AC | PRN
  Administered 2024-08-27: 5 mg via ORAL

## 2024-08-27 MED ORDER — CEFAZOLIN SODIUM-DEXTROSE 2-3 GM-%(50ML) IV SOLR
INTRAVENOUS | Status: DC | PRN
  Administered 2024-08-27: 2 g via INTRAVENOUS

## 2024-08-27 MED ORDER — EPHEDRINE SULFATE (PRESSORS) 25 MG/5ML IV SOSY
PREFILLED_SYRINGE | INTRAVENOUS | Status: DC | PRN
  Administered 2024-08-27: 10 mg via INTRAVENOUS

## 2024-08-27 MED ORDER — LACTATED RINGERS IV SOLN
INTRAVENOUS | Status: DC | PRN
Start: 2024-08-27 — End: 2024-08-27

## 2024-08-27 MED ORDER — DEXAMETHASONE SOD PHOSPHATE PF 10 MG/ML IJ SOLN
INTRAMUSCULAR | Status: DC | PRN
  Administered 2024-08-27: 10 mg

## 2024-08-27 MED ORDER — 0.9 % SODIUM CHLORIDE (POUR BTL) OPTIME
TOPICAL | Status: DC | PRN
  Administered 2024-08-27: 1000 mL

## 2024-08-27 MED ORDER — LIDOCAINE HCL (CARDIAC) PF 100 MG/5ML IV SOSY
PREFILLED_SYRINGE | INTRAVENOUS | Status: DC | PRN
  Administered 2024-08-27: 60 mg via INTRAVENOUS

## 2024-08-27 MED ORDER — FENTANYL CITRATE (PF) 100 MCG/2ML IJ SOLN: 25.0000 ug | INTRAMUSCULAR | Status: DC | PRN

## 2024-08-27 MED ORDER — DROPERIDOL 2.5 MG/ML IJ SOLN: 0.6250 mg | Freq: Once | INTRAMUSCULAR | Status: DC | PRN

## 2024-08-27 SURGICAL SUPPLY — 40 items
BENZOIN TINCTURE PRP APPL 2/3 (GAUZE/BANDAGES/DRESSINGS) ×2 IMPLANT
BLADE CLIPPER SURG (BLADE) IMPLANT
BLADE HEX COATED 2.75 (ELECTRODE) ×2 IMPLANT
BLADE SURG 15 STRL LF DISP TIS (BLADE) ×2 IMPLANT
CHLORAPREP W/TINT 26 (MISCELLANEOUS) ×2 IMPLANT
COVER BACK TABLE 60X90IN (DRAPES) ×2 IMPLANT
COVER MAYO STAND STRL (DRAPES) ×2 IMPLANT
DRAIN PENROSE .5X12 LATEX STL (DRAIN) ×2 IMPLANT
DRAPE LAPAROTOMY TRNSV 102X78 (DRAPES) ×2 IMPLANT
DRAPE UTILITY XL STRL (DRAPES) ×2 IMPLANT
DRSG TEGADERM 4X4.75 (GAUZE/BANDAGES/DRESSINGS) ×2 IMPLANT
ELECTRODE REM PT RTRN 9FT ADLT (ELECTROSURGICAL) ×2 IMPLANT
GAUZE 4X4 16PLY ~~LOC~~+RFID DBL (SPONGE) ×2 IMPLANT
GAUZE SPONGE 4X4 12PLY STRL (GAUZE/BANDAGES/DRESSINGS) ×2 IMPLANT
GAUZE SPONGE 4X4 12PLY STRL LF (GAUZE/BANDAGES/DRESSINGS) ×2 IMPLANT
GAUZE SPONGE 4X4 8PLY NS (GAUZE/BANDAGES/DRESSINGS) IMPLANT
GLOVE BIO SURGEON STRL SZ7 (GLOVE) ×2 IMPLANT
GLOVE BIOGEL PI IND STRL 7.5 (GLOVE) ×2 IMPLANT
GOWN STRL REUS W/ TWL LRG LVL3 (GOWN DISPOSABLE) ×4 IMPLANT
MESH PARIETEX PROGRIP LEFT (Mesh General) IMPLANT
NDL HYPO 25X1 1.5 SAFETY (NEEDLE) ×2 IMPLANT
NEEDLE HYPO 25X1 1.5 SAFETY (NEEDLE) ×1 IMPLANT
PACK BASIN DAY SURGERY FS (CUSTOM PROCEDURE TRAY) ×2 IMPLANT
PENCIL SMOKE EVACUATOR (MISCELLANEOUS) ×2 IMPLANT
SLEEVE SCD COMPRESS KNEE MED (STOCKING) ×2 IMPLANT
SOLN 0.9% NACL POUR BTL 1000ML (IV SOLUTION) IMPLANT
SPIKE FLUID TRANSFER (MISCELLANEOUS) ×2 IMPLANT
SPONGE INTESTINAL PEANUT (DISPOSABLE) ×2 IMPLANT
STRIP CLOSURE SKIN 1/2X4 (GAUZE/BANDAGES/DRESSINGS) ×2 IMPLANT
SUT MNCRL AB 4-0 PS2 18 (SUTURE) ×2 IMPLANT
SUT SILK 2 0 SH (SUTURE) IMPLANT
SUT SILK 3-0 18XBRD TIE BLK (SUTURE) IMPLANT
SUT VIC AB 0 CT1 27XBRD ANBCTR (SUTURE) IMPLANT
SUT VIC AB 0 CT2 27 (SUTURE) ×2 IMPLANT
SUT VIC AB 2-0 SH 27XBRD (SUTURE) ×2 IMPLANT
SUT VIC AB 3-0 SH 27X BRD (SUTURE) ×2 IMPLANT
SYR CONTROL 10ML LL (SYRINGE) ×2 IMPLANT
TOWEL GREEN STERILE FF (TOWEL DISPOSABLE) ×2 IMPLANT
TUBE CONNECTING 20X1/4 (TUBING) IMPLANT
YANKAUER SUCT BULB TIP NO VENT (SUCTIONS) IMPLANT

## 2024-08-27 NOTE — Anesthesia Procedure Notes (Signed)
 Procedure Name: LMA Insertion Date/Time: 08/27/2024 7:40 AM  Performed by: Burnard Rosaline HERO, CRNAPre-anesthesia Checklist: Patient identified, Emergency Drugs available, Suction available and Patient being monitored Patient Re-evaluated:Patient Re-evaluated prior to induction Oxygen Delivery Method: Circle system utilized Preoxygenation: Pre-oxygenation with 100% oxygen Induction Type: IV induction Ventilation: Mask ventilation without difficulty LMA: LMA inserted LMA Size: 4.0 Number of attempts: 1 Airway Equipment and Method: Bite block Placement Confirmation: positive ETCO2, CO2 detector and breath sounds checked- equal and bilateral Tube secured with: Tape Dental Injury: Teeth and Oropharynx as per pre-operative assessment

## 2024-08-27 NOTE — Anesthesia Postprocedure Evaluation (Signed)
 Anesthesia Post Note  Patient: Carl Moody  Procedure(s) Performed: REPAIR, HERNIA, INGUINAL, ADULT (Left)     Patient location during evaluation: PACU Anesthesia Type: General and Regional Level of consciousness: awake and alert Pain management: pain level controlled Vital Signs Assessment: post-procedure vital signs reviewed and stable Respiratory status: spontaneous breathing, nonlabored ventilation, respiratory function stable and patient connected to nasal cannula oxygen Cardiovascular status: blood pressure returned to baseline and stable Postop Assessment: no apparent nausea or vomiting Anesthetic complications: no   No notable events documented.  Last Vitals:  Vitals:   08/27/24 0845 08/27/24 0926  BP: (!) 117/58 (!) 123/44  Pulse: 70 65  Resp: 17 16  Temp:  36.5 C  SpO2: 99% 100%    Last Pain:  Vitals:   08/27/24 0926  TempSrc:   PainSc: 4                  Tomasita Beevers P Tiago Humphrey

## 2024-08-27 NOTE — Discharge Instructions (Addendum)
 [facility]  INGUINAL HERNIA REPAIR: POST OP INSTRUCTIONS  Always review your discharge instruction sheet given to you by the facility where your surgery was performed. IF YOU HAVE DISABILITY OR FAMILY LEAVE FORMS, YOU MUST BRING THEM TO THE OFFICE FOR PROCESSING.   DO NOT GIVE THEM TO YOUR DOCTOR.  1. A  prescription for pain medication may be given to you upon discharge.  Take your pain medication as prescribed, if needed.  If narcotic pain medicine is not needed, then you may take acetaminophen  (Tylenol ) or ibuprofen (Advil) as needed. 2. Take your usually prescribed medications unless otherwise directed. If you need a refill on your pain medication, please contact your pharmacy.  They will contact our office to request authorization. Prescriptions will not be filled after 5 pm or on week-ends. 3. You should follow a light diet the first 24 hours after arrival home, such as soup and crackers, etc.  Be sure to include lots of fluids daily.  Resume your normal diet the day after surgery. 4.Most patients will experience some swelling and bruising in the groin and scrotum.  Ice packs and reclining will help.  Swelling and bruising can take several days to resolve.  6. It is common to experience some constipation if taking pain medication after surgery.  Increasing fluid intake and taking a stool softener (such as Colace) will usually help or prevent this problem from occurring.  A mild laxative (Milk of Magnesia or Miralax ) should be taken according to package directions if there are no bowel movements after 48 hours. 7. Unless discharge instructions indicate otherwise, you may remove your bandages 24-48 hours after surgery, and you may shower at that time.  You may have steri-strips (small skin tapes) in place directly over the incision.  These strips should be left on the skin for 7-10 days.   8. ACTIVITIES:  You may resume regular (light) daily activities beginning the  next day--such as daily self-care, walking, climbing stairs--gradually increasing activities as tolerated.  You may have sexual intercourse when it is comfortable.  Refrain from any heavy lifting or straining until approved by your doctor.  a.You may drive when you are no longer taking prescription pain medication, you can comfortably wear a seatbelt, and you can safely maneuver your car and apply brakes. b.RETURN TO WORK:   _____________________________________________  9.You should see your doctor in the office for a follow-up appointment approximately 2-3 weeks after your surgery.  Make sure that you call for this appointment within a day or two after you arrive home to insure a convenient appointment time. 10.OTHER INSTRUCTIONS: _________________________    _____________________________________  WHEN TO CALL YOUR DOCTOR: Fever over 101.0 Inability to urinate Nausea and/or vomiting Extreme swelling or bruising Continued bleeding from incision. Increased pain, redness, or drainage from the incision  The clinic staff is available to answer your questions during regular business hours.  Please don't hesitate to call and ask to speak to one of the nurses for clinical concerns.  If you have a medical emergency, go to the nearest emergency room or call 911.  A surgeon from [facility] is always on call at the hospital   [address], [place], [place]  [address] ?  [address] [address], [place], [place]   [address] [phone] ? [phone] ? FAX [phone] Web site: [email or web address]  Post Anesthesia Home Care Instructions  Activity: Get plenty of rest for the remainder of the day. A responsible individual  must stay with you for 24 hours following the procedure.  For the next 24 hours, DO NOT: -Drive a car -Advertising copywriter -Drink alcoholic beverages -Take any medication unless instructed by your physician -Make any legal decisions or sign  important papers.  Meals: Start with liquid foods such as gelatin or soup. Progress to regular foods as tolerated. Avoid greasy, spicy, heavy foods. If nausea and/or vomiting occur, drink only clear liquids until the nausea and/or vomiting subsides. Call your physician if vomiting continues.  Special Instructions/Symptoms: Your throat may feel dry or sore from the anesthesia or the breathing tube placed in your throat during surgery. If this causes discomfort, gargle with warm salt water. The discomfort should disappear within 24 hours.  If you had a scopolamine  patch placed behind your ear for the management of post- operative nausea and/or vomiting:  1. The medication in the patch is effective for 72 hours, after which it should be removed.  Wrap patch in a tissue and discard in the trash. Wash hands thoroughly with soap and water. 2. You may remove the patch earlier than 72 hours if you experience unpleasant side effects which may include dry mouth, dizziness or visual disturbances. 3. Avoid touching the patch. Wash your hands with soap and water after contact with the patch.      Regional Anesthesia Blocks  1. You may not be able to move or feel the blocked extremity after a regional anesthetic block. This may last may last from 3-48 hours after placement, but it will go away. The length of time depends on the medication injected and your individual response to the medication. As the nerves start to wake up, you may experience tingling as the movement and feeling returns to your extremity. If the numbness and inability to move your extremity has not gone away after 48 hours, please call your surgeon.   2. The extremity that is blocked will need to be protected until the numbness is gone and the strength has returned. Because you cannot feel it, you will need to take extra care to avoid injury. Because it may be weak, you may have difficulty moving it or using it. You may not know what  position it is in without looking at it while the block is in effect.  3. For blocks in the legs and feet, returning to weight bearing and walking needs to be done carefully. You will need to wait until the numbness is entirely gone and the strength has returned. You should be able to move your leg and foot normally before you try and bear weight or walk. You will need someone to be with you when you first try to ensure you do not fall and possibly risk injury.  4. Bruising and tenderness at the needle site are common side effects and will resolve in a few days.  5. Persistent numbness or new problems with movement should be communicated to the surgeon or the [facility] [phone] [facility] [phone]).    Next dose of Tylenol  may be given at 12:30pm if needed.

## 2024-08-27 NOTE — Anesthesia Preprocedure Evaluation (Signed)
 Anesthesia Evaluation  Patient identified by MRN, date of birth, ID band Patient awake    Reviewed: Allergy & Precautions, NPO status , Patient's Chart, lab work & pertinent test results  Airway Mallampati: II  TM Distance: >3 FB Neck ROM: Full    Dental no notable dental hx.    Pulmonary neg pulmonary ROS, former smoker   Pulmonary exam normal        Cardiovascular hypertension,  Rhythm:Regular Rate:Normal     Neuro/Psych negative neurological ROS  negative psych ROS   GI/Hepatic Neg liver ROS,GERD  ,,Inguinal hernia    Endo/Other  negative endocrine ROS    Renal/GU negative Renal ROS  negative genitourinary   Musculoskeletal  (+) Arthritis , Osteoarthritis,    Abdominal Normal abdominal exam  (+)   Peds  Hematology Lab Results      Component                Value               Date                      WBC                      7.7                 01/18/2020                HGB                      12.6 (L)            07/14/2021                HCT                      37.0 (L)            07/14/2021                MCV                      89.0                01/18/2020                PLT                      224                 01/18/2020              Anesthesia Other Findings   Reproductive/Obstetrics                              Anesthesia Physical Anesthesia Plan  ASA: 2  Anesthesia Plan: General   Post-op Pain Management: Regional block*   Induction: Intravenous  PONV Risk Score and Plan: 2 and Ondansetron , Dexamethasone  and Treatment may vary due to age or medical condition  Airway Management Planned: Mask and LMA  Additional Equipment: None  Intra-op Plan:   Post-operative Plan: Extubation in OR  Informed Consent: I have reviewed the patients History and Physical, chart, labs and discussed the procedure including the risks, benefits and alternatives for the proposed  anesthesia with the patient or authorized representative who has indicated his/her understanding  and acceptance.     Dental advisory given  Plan Discussed with: CRNA  Anesthesia Plan Comments:         Anesthesia Quick Evaluation

## 2024-08-27 NOTE — Progress Notes (Signed)
Assisted Dr. Jana Half with left, transabdominal plane, ultrasound guided block. Side rails up, monitors on throughout procedure. See vital signs in flow sheet. Tolerated Procedure well.

## 2024-08-27 NOTE — Transfer of Care (Signed)
 Immediate Anesthesia Transfer of Care Note  Patient: Carl Moody  Procedure(s) Performed: REPAIR, HERNIA, INGUINAL, ADULT (Left)  Patient Location: PACU  Anesthesia Type:General and regional  Level of Consciousness: awake, alert , and oriented  Airway & Oxygen Therapy: Patient Spontanous Breathing and Patient connected to face mask oxygen  Post-op Assessment: Report given to RN and Post -op Vital signs reviewed and stable  Post vital signs: Reviewed and stable  Last Vitals:  Vitals Value Taken Time  BP 124/64 08/27/24 08:38  Temp 36.6 C 08/27/24 08:38  Pulse 70 08/27/24 08:44  Resp 17 08/27/24 08:44  SpO2 99 % 08/27/24 08:44  Vitals shown include unfiled device data.  Last Pain:  Vitals:   08/27/24 0838  TempSrc:   PainSc: Asleep      Patients Stated Pain Goal: 4 (08/27/24 9371)  Complications: No notable events documented.

## 2024-08-27 NOTE — Anesthesia Procedure Notes (Signed)
 Anesthesia Regional Block: TAP block   Pre-Anesthetic Checklist: , timeout performed,  Correct Patient, Correct Site, Correct Laterality,  Correct Procedure, Correct Position, site marked,  Risks and benefits discussed,  Surgical consent,  Pre-op evaluation,  At surgeon's request and post-op pain management  Laterality: Left  Prep: Dura Prep       Needles:  Injection technique: Single-shot  Needle Type: Echogenic Stimulator Needle     Needle Length: 10cm  Needle Gauge: 20     Additional Needles:   Procedures:,,,, ultrasound used (permanent image in chart),,    Narrative:  Start time: 08/27/2024 7:07 AM End time: 08/27/2024 7:12 AM Injection made incrementally with aspirations every 5 mL.  Performed by: Personally  Anesthesiologist: Dorethea Cordella SQUIBB, DO  Additional Notes: Patient identified. Risks/Benefits/Options discussed with patient including but not limited to bleeding, infection, nerve damage, failed block, incomplete pain control. Patient expressed understanding and wished to proceed. All questions were answered. Sterile technique was used throughout the entire procedure. Please see nursing notes for vital signs. Aspirated in 5cc intervals with injection for negative confirmation. Patient was given instructions on fall risk and not to get out of bed. All questions and concerns addressed with instructions to call with any issues or inadequate analgesia.

## 2024-08-27 NOTE — Op Note (Signed)
 Open left inguinal hernia repair with mesh Operative Note  Indications: This is a 83 year old male in relatively good health who presents with approximately 1 year history of a small visible bulge in his left groin. Recently this has become larger. There is minimal discomfort associated with it. He remains reducible. He denies any obstructive symptoms. He brought this to the attention of the nurse practitioner with his PCP. She obtained a CT scan which showed a moderate-sized left inguinal hernia with no involvement of the bowel. Incidentally, he also has a hiatal hernia as well as sigmoid colon diverticulosis. He presents now to discuss inguinal hernia repair.   Pre-operative Diagnosis: left reducible inguinal hernia Post-operative Diagnosis: same  Surgeon: Donnice MARLA Lima   Assistants: none  Anesthesia: General LMA anesthesia and TAP block  ASA Class: 2  Procedure Details  The patient was seen again in the Holding Room. The risks, benefits, complications, treatment options, and expected outcomes were discussed with the patient. The possibilities of reaction to medication, pulmonary aspiration, perforation of viscus, bleeding, recurrent infection, the need for additional procedures, and development of a complication requiring transfusion or further operation were discussed with the patient and/or family. The likelihood of success in repairing the hernia and returning the patient to their previous functional status is good.  There was concurrence with the proposed plan, and informed consent was obtained. The site of surgery was properly noted/marked. The patient was taken to the Operating Room, identified as [name], and the procedure verified as left inguinal hernia repair. A Time Out was held and the above information confirmed.  The patient was placed in the supine position and underwent induction of anesthesia. The lower abdomen and groin was prepped with Chloraprep and draped in the  standard fashion, and 0.25% Marcaine  with epinephrine  was used to anesthetize the skin over the mid-portion of the inguinal canal. An oblique incision was made. Dissection was carried down through the subcutaneous tissue with cautery to the external oblique fascia.  We opened the external oblique fascia along the direction of its fibers to the external ring.  The spermatic cord was circumferentially dissected bluntly and retracted with a Penrose drain.  The ilioinguinal nerve was identified and preserved.  The floor of the inguinal canal was inspected and revealed a large direct defect.  We dissected the direct hernia sac free, reduced it, and closed the floor of the inguinal canal with 0 Vicryl. We skeletonized the spermatic cord and reduced a moderate-sized cord lipoma.  We used a left Progrip mesh which was inserted and deployed across the floor of the inguinal canal. The mesh was tucked underneath the external oblique fascia laterally.  The flap of the mesh was closed around the spermatic cord to recreate the internal inguinal ring.  The mesh was secured to the pubic tubercle with 0 Vicryl.  Additional stay sutures were placed to attach the inferior edge of the mesh to the shelving edge.  The external oblique fascia was reapproximated with 2-0 Vicryl.  3-0 Vicryl was used to close the subcutaneous tissues and 4-0 Monocryl was used to close the skin in subcuticular fashion.  Benzoin and steri-strips were used to seal the incision.  A clean dressing was applied.  The patient was then extubated and brought to the recovery room in stable condition.  All sponge, instrument, and needle counts were correct prior to closure and at the conclusion of the case.   Estimated Blood Loss: Minimal  Complications: None; patient tolerated the procedure well.         Disposition: PACU - hemodynamically stable.         Condition: stable  Donnice POUR. Belinda, MD, [facility]  General  Surgery   08/27/2024 8:33 AM

## 2024-08-27 NOTE — Interval H&P Note (Signed)
 History and Physical Interval Note:  08/27/2024 7:08 AM  [name]  has presented today for surgery, with the diagnosis of LEFT INGUINAL HERNIA.  The various methods of treatment have been discussed with the patient and family. After consideration of risks, benefits and other options for treatment, the patient has consented to  Procedure(s) with comments: REPAIR, HERNIA, INGUINAL, ADULT (Left) - OPEN, LEFT INGUINAL, MESH LMA & TAP BLOCK as a surgical intervention.  The patient's history has been reviewed, patient examined, no change in status, stable for surgery.  I have reviewed the patient's chart and labs.  Questions were answered to the patient's satisfaction.     Carl Moody

## 2024-08-28 ENCOUNTER — Encounter (HOSPITAL_BASED_OUTPATIENT_CLINIC_OR_DEPARTMENT_OTHER): Payer: Self-pay | Admitting: Surgery
